# Patient Record
Sex: Female | Born: 1985 | Race: Asian | Hispanic: No | Marital: Married | Smoking: Never smoker
Health system: Southern US, Community
[De-identification: ages and names within clinical notes are randomized; demographics above are authoritative.]

## PROBLEM LIST (undated history)

## (undated) DIAGNOSIS — D649 Anemia, unspecified: Secondary | ICD-10-CM

## (undated) DIAGNOSIS — Z789 Other specified health status: Secondary | ICD-10-CM

## (undated) DIAGNOSIS — IMO0002 Reserved for concepts with insufficient information to code with codable children: Secondary | ICD-10-CM

## (undated) DIAGNOSIS — K219 Gastro-esophageal reflux disease without esophagitis: Secondary | ICD-10-CM

## (undated) HISTORY — PX: NO PAST SURGERIES: SHX2092

---

## 2009-12-29 ENCOUNTER — Emergency Department: Payer: Self-pay | Admitting: Emergency Medicine

## 2010-04-28 ENCOUNTER — Encounter: Payer: Self-pay | Admitting: Family Medicine

## 2010-04-28 ENCOUNTER — Ambulatory Visit: Payer: 59 | Admitting: Family Medicine

## 2010-04-28 DIAGNOSIS — J019 Acute sinusitis, unspecified: Secondary | ICD-10-CM

## 2010-04-28 DIAGNOSIS — J029 Acute pharyngitis, unspecified: Secondary | ICD-10-CM

## 2010-04-28 DIAGNOSIS — J309 Allergic rhinitis, unspecified: Secondary | ICD-10-CM | POA: Insufficient documentation

## 2010-05-03 NOTE — Assessment & Plan Note (Signed)
Summary: FEVER AND COUGH/EVM   Vital Signs:  Patient Profile:   25 Years Old Female CC:      fever, cough Height:     62 inches Weight:      156 pounds BMI:     28.64 O2 Sat:      98 % O2 treatment:    Room Air Temp:     98.0 degrees F oral Pulse rate:   98 / minute BP sitting:   126 / 87  (left arm) Cuff size:   regular  Vitals Entered By: Haze Boyden, CMA (April 28, 2010 3:06 PM)                  Current Allergies (reviewed today): No known allergies History of Present Illness History from: patient Reason for visit: see chief complaint Chief Complaint: fever, cough History of Present Illness: The patient reports that she is new to the area and having  sneezing, cough, runny nose, headaches in the frontal and parietal areas.  She is having some postnasal drainage and reports that she is having some sore throat.  The patient has been exposed to her spouse that has been diagnosed in the URI.   The patient denies nausea and vomiting.    REVIEW OF SYSTEMS Constitutional Symptoms       Complains of fever and chills.     Denies night sweats, weight loss, weight gain, and fatigue.  Eyes       Denies change in vision, eye pain, eye discharge, glasses, contact lenses, and eye surgery. Ear/Nose/Throat/Mouth       Complains of ear pain, frequent runny nose, and sore throat.      Denies hearing loss/aids, change in hearing, ear discharge, dizziness, frequent nose bleeds, sinus problems, hoarseness, and tooth pain or bleeding.  Respiratory       Complains of dry cough and productive cough.      Denies wheezing, shortness of breath, asthma, bronchitis, and emphysema/COPD.  Cardiovascular       Denies murmurs, chest pain, and tires easily with exhertion.    Gastrointestinal       Complains of stomach pain.      Denies nausea/vomiting, diarrhea, constipation, blood in bowel movements, and indigestion. Genitourniary       Denies painful urination, kidney stones, and loss of  urinary control. Neurological       Complains of headaches.      Denies paralysis, seizures, and fainting/blackouts. Musculoskeletal       Complains of muscle pain and joint pain.      Denies joint stiffness, decreased range of motion, redness, swelling, muscle weakness, and gout.  Skin       Denies bruising, unusual mles/lumps or sores, and hair/skin or nail changes.  Psych       Denies mood changes, temper/anger issues, anxiety/stress, speech problems, depression, and sleep problems.  Past History:  Family History: Last updated: 04/28/2010 mother alive healthy father alive healthy brother alive healthy  Social History: Last updated: 04/28/2010 Married Never Smoked Alcohol use-no Drug use-no Regular exercise-no  Risk Factors: Exercise: no (04/28/2010)  Risk Factors: Smoking Status: never (04/28/2010)  Past Medical History: Unremarkable  Past Surgical History: Denies surgical history  Family History: mother alive healthy father alive healthy brother alive healthy  Social History: Married Never Smoked Alcohol use-no Drug use-no Regular exercise-no Smoking Status:  never Drug Use:  no Does Patient Exercise:  no Physical Exam General appearance: well developed, well nourished, no acute distress  Head: normocephalic, atraumatic Eyes: conjunctivae and lids normal Pupils: equal, round, reactive to light Ears: normal, no lesions or deformities Nasal: pale, boggy, swollen nasal turbinates Oral/Pharynx: pharyngeal erythema without exudate, uvula midline without deviation Neck: neck supple,  trachea midline, no masses Chest/Lungs: no rales, wheezes, or rhonchi bilateral, breath sounds equal without effort Heart: regular rate and  rhythm, no murmur Abdomen: soft, non-tender without obvious organomegaly Extremities: normal extremities Neurological: grossly intact and non-focal Skin: no obvious rashes or lesions MSE: oriented to time, place, and  person Assessment New Problems: ALLERGIC RHINITIS CAUSE UNSPECIFIED (ICD-477.9) ACUTE SINUSITIS, UNSPECIFIED (ICD-461.9) SORE THROAT (ICD-462)   Patient Education: Patient and/or caregiver instructed in the following: rest, fluids, Tylenol prn, Ibuprofen prn. The risks, benefits and possible side effects were clearly explained and discussed with the patient.  The patient verbalized clear understanding.  The patient was given instructions to return if symptoms don't improve, worsen or new changes develop.  If it is not during clinic hours and the patient cannot get back to this clinic then the patient was told to seek medical care at an available urgent care or emergency department.  The patient verbalized understanding.   Demonstrates willingness to comply.  Plan New Medications/Changes: IBUPROFEN 400 MG TABS (IBUPROFEN) take 1 to 2 tabs by mouth every 8 hours with food as needed headache  #12 x 0, 04/28/2010, Clanford Johnson MD LORATADINE 10 MG TABS (LORATADINE) take 1 by mouth daily for allergies Runny Nose, Sneezing, Nasal Congestion  #30 x 1, 04/28/2010, Clanford Johnson MD FLUTICASONE PROPIONATE 50 MCG/ACT SUSP (FLUTICASONE PROPIONATE) 2 sprays per nostril once daily  #1 x 0, 04/28/2010, Clanford Johnson MD AMOXICILLIN 500 MG CAPS (AMOXICILLIN) take 1 by mouth three times a day until completed  #30 x 0, 04/28/2010, Clanford Johnson MD  Follow Up: Follow up in 2-3 days if no improvement, Follow up on an as needed basis, Follow up with Primary Physician  The patient and/or caregiver has been counseled thoroughly with regard to medications prescribed including dosage, schedule, interactions, rationale for use, and possible side effects and they verbalize understanding.  Diagnoses and expected course of recovery discussed and will return if not improved as expected or if the condition worsens. Patient and/or caregiver verbalized understanding.  Prescriptions: IBUPROFEN 400 MG TABS  (IBUPROFEN) take 1 to 2 tabs by mouth every 8 hours with food as needed headache  #12 x 0   Entered and Authorized by:   Standley Dakins MD   Signed by:   Standley Dakins MD on 04/28/2010   Method used:   Electronically to        [facility] [address] (retail)       [address], [address]       [place], [place]  [address]       Ph: [phone]       Fax: [phone]   RxID:   [id number] LORATADINE 10 MG TABS (LORATADINE) take 1 by mouth daily for allergies Runny Nose, Sneezing, Nasal Congestion  #30 x 1   Entered and Authorized by:   Standley Dakins MD   Signed by:   Standley Dakins MD on 04/28/2010   Method used:   Electronically to        [facility] [address] (retail)       [address], [address]       [place], [place]  [address]       Ph: [phone]       Fax: [phone]   RxID:   [id number] FLUTICASONE PROPIONATE 50 MCG/ACT SUSP (FLUTICASONE PROPIONATE) 2 sprays per nostril once daily  #1 x 0   Entered and Authorized by:   Standley Dakins MD   Signed by:   Standley Dakins MD on 04/28/2010   Method used:   Electronically to        [facility] [address] (retail)       [address], [address]       [place], [place]  [address]       Ph: [phone]       Fax: [phone]   RxID:   [id number] AMOXICILLIN 500 MG CAPS (AMOXICILLIN) take 1 by mouth three times a day until completed  #30 x 0   Entered and Authorized by:   Standley Dakins MD   Signed by:   Standley Dakins MD on 04/28/2010   Method used:   Electronically to        [facility] [address] (retail)       [address], [address]       [place], [place]  [address]       Ph: [phone]       Fax: [phone]   RxID:   [id number]   Patient Instructions: 1)  Go to the pharmacy and pick up your prescription (s).  It may take up to 30 mins for electronic  prescriptions to be delivered to the pharmacy.  Please call if your pharmacy has not received your prescriptions after 30 minutes.   2)  Take 650-1000mg  of Tylenol every 4-6 hours as needed for relief of pain or comfort of fever AVOID taking more than 4000mg   in a 24 hour period (can cause liver damage in higher doses). 3)  Take 400-600mg  of Ibuprofen (Advil, Motrin) with food every 8 hours as needed for relief of pain or comfort of fever. 4)  Oral Rehydration Solution: drink 1/2 ounce every 15 minutes. If tolerated afert 1 hour, drink 1 ounce every 15 minutes. As you can tolerate, keep adding 1/2 ounce every 15 minutes, up to a total of 2-4 ounces. Contact the office if unable to tolerate oral solution, if you keep vomiting, or you continue to have signs of dehydration. 5)  Take your antibiotic as prescribed until ALL of it is gone, but stop if you develop a rash or swelling and contact our office as soon as possible. 6)  Acute sinusitis symptoms for less than 10 days are not helped by antibiotics.Use warm moist compresses, and over the counter decongestants ( only as directed). Call if no improvement in 5-7 days, sooner if increasing pain, fever, or new symptoms. 7)  Return or go to the ER if no improvement or symptoms getting worse.   8)  The patient was informed that there is no on-call provider or services available at this clinic during off-hours (when the clinic is closed).  If the patient developed a problem or concern that required immediate attention, the patient was advised to go the the nearest available urgent care or emergency department for medical care.  The patient verbalized understanding.

## 2012-04-20 ENCOUNTER — Other Ambulatory Visit (HOSPITAL_COMMUNITY): Payer: Self-pay | Admitting: Obstetrics & Gynecology

## 2012-04-20 DIAGNOSIS — N979 Female infertility, unspecified: Secondary | ICD-10-CM

## 2012-04-24 ENCOUNTER — Ambulatory Visit (HOSPITAL_COMMUNITY)
Admission: RE | Admit: 2012-04-24 | Discharge: 2012-04-24 | Disposition: A | Discharge status: Home / Self Care | Payer: 59 | Source: Ambulatory Visit | Attending: Obstetrics & Gynecology | Admitting: Obstetrics & Gynecology

## 2012-04-24 DIAGNOSIS — N979 Female infertility, unspecified: Secondary | ICD-10-CM | POA: Insufficient documentation

## 2012-04-24 MED ORDER — IOHEXOL 300 MG/ML  SOLN
20.0000 mL | Freq: Once | INTRAMUSCULAR | Status: AC | PRN
  Administered 2012-04-24: 20 mL

## 2012-10-06 LAB — OB RESULTS CONSOLE ABO/RH: RH Type: POSITIVE

## 2012-10-06 LAB — OB RESULTS CONSOLE RPR: RPR: NONREACTIVE

## 2012-10-06 LAB — OB RESULTS CONSOLE RUBELLA ANTIBODY, IGM: Rubella: IMMUNE

## 2012-10-06 LAB — OB RESULTS CONSOLE HEPATITIS B SURFACE ANTIGEN: Hepatitis B Surface Ag: NEGATIVE

## 2012-10-06 LAB — OB RESULTS CONSOLE HIV ANTIBODY (ROUTINE TESTING): HIV: NONREACTIVE

## 2012-10-06 LAB — OB RESULTS CONSOLE ANTIBODY SCREEN: ANTIBODY SCREEN: NEGATIVE

## 2012-10-16 LAB — OB RESULTS CONSOLE GC/CHLAMYDIA
CHLAMYDIA, DNA PROBE: NEGATIVE
GC PROBE AMP, GENITAL: NEGATIVE

## 2013-03-18 NOTE — L&D Delivery Note (Signed)
Operative Delivery Note At 4:45 PM a viable and healthy female was delivered via Vaginal, Vacuum Investment banker, operational(Extractor).  Presentation: vertex; Position: Occiput,, Anterior; Station: +2. 3 controlled pushes with 2 contractions each with excellent maternal effort and no pop-offs.   Verbal consent: obtained from patient.  Risks and benefits discussed in detail.  Risks include, but are not limited to the risks of anesthesia, bleeding, infection, damage to maternal tissues, fetal cephalhematoma.  There is also the risk of inability to effect vaginal delivery of the head, or shoulder dystocia that cannot be resolved by established maneuvers, leading to the need for emergency cesarean section.  APGAR: 9, 9; weight .   Placenta status: Intact, Spontaneous.   Cord: 3 vessels with the following complications: None.  Cord pH: 7.3  Anesthesia: Epidural Local 1% Lidocaine Instruments: Kiwi vacuum Episiotomy: Left Mediolateral Lacerations: Sulcus bilateral Suture Repair: 3.0 vicryl rapide Est. Blood Loss (mL): 500  Mom to postpartum.  Baby to Couplet care / Skin to Skin.  Eldra Word R 04/29/2013, 5:29 PM

## 2013-04-09 LAB — OB RESULTS CONSOLE GBS: GBS: POSITIVE

## 2013-04-29 ENCOUNTER — Inpatient Hospital Stay (HOSPITAL_COMMUNITY)
Admission: AD | Admit: 2013-04-29 | Discharge: 2013-05-01 | DRG: 775 | Disposition: A | Discharge status: Home / Self Care | Payer: 59 | Source: Ambulatory Visit | Attending: Obstetrics & Gynecology | Admitting: Obstetrics & Gynecology

## 2013-04-29 ENCOUNTER — Encounter (HOSPITAL_COMMUNITY): Payer: 59 | Admitting: Anesthesiology

## 2013-04-29 ENCOUNTER — Encounter (HOSPITAL_COMMUNITY): Payer: Self-pay

## 2013-04-29 ENCOUNTER — Inpatient Hospital Stay (HOSPITAL_COMMUNITY): Payer: 59 | Admitting: Anesthesiology

## 2013-04-29 DIAGNOSIS — IMO0001 Reserved for inherently not codable concepts without codable children: Secondary | ICD-10-CM

## 2013-04-29 DIAGNOSIS — O99892 Other specified diseases and conditions complicating childbirth: Secondary | ICD-10-CM | POA: Diagnosis present

## 2013-04-29 DIAGNOSIS — E282 Polycystic ovarian syndrome: Secondary | ICD-10-CM | POA: Diagnosis present

## 2013-04-29 DIAGNOSIS — Z22330 Carrier of Group B streptococcus: Secondary | ICD-10-CM

## 2013-04-29 DIAGNOSIS — J309 Allergic rhinitis, unspecified: Secondary | ICD-10-CM

## 2013-04-29 DIAGNOSIS — O34599 Maternal care for other abnormalities of gravid uterus, unspecified trimester: Secondary | ICD-10-CM | POA: Diagnosis present

## 2013-04-29 DIAGNOSIS — O9989 Other specified diseases and conditions complicating pregnancy, childbirth and the puerperium: Secondary | ICD-10-CM

## 2013-04-29 HISTORY — DX: Reserved for concepts with insufficient information to code with codable children: IMO0002

## 2013-04-29 LAB — CBC
HCT: 39.7 % (ref 36.0–46.0)
Hemoglobin: 13.4 g/dL (ref 12.0–15.0)
MCH: 28 pg (ref 26.0–34.0)
MCHC: 33.8 g/dL (ref 30.0–36.0)
MCV: 83.1 fL (ref 78.0–100.0)
Platelets: 193 10*3/uL (ref 150–400)
RBC: 4.78 MIL/uL (ref 3.87–5.11)
RDW: 14.2 % (ref 11.5–15.5)
WBC: 10.9 10*3/uL — ABNORMAL HIGH (ref 4.0–10.5)

## 2013-04-29 LAB — TYPE AND SCREEN
ABO/RH(D): A POS
Antibody Screen: NEGATIVE

## 2013-04-29 LAB — ABO/RH: ABO/RH(D): A POS

## 2013-04-29 LAB — RPR: RPR Ser Ql: NONREACTIVE

## 2013-04-29 LAB — POCT FERN TEST: POCT FERN TEST: POSITIVE

## 2013-04-29 MED ORDER — DEXTROSE 5 % IV SOLN
5.0000 10*6.[IU] | Freq: Once | INTRAVENOUS | Status: AC
  Administered 2013-04-29: 5 10*6.[IU] via INTRAVENOUS
  Filled 2013-04-29: qty 5

## 2013-04-29 MED ORDER — ZOLPIDEM TARTRATE 5 MG PO TABS: 5.0000 mg | ORAL_TABLET | Freq: Every evening | ORAL | Status: DC | PRN

## 2013-04-29 MED ORDER — LACTATED RINGERS IV SOLN: 500.0000 mL | INTRAVENOUS | Status: DC | PRN

## 2013-04-29 MED ORDER — LIDOCAINE HCL (PF) 1 % IJ SOLN
INTRAMUSCULAR | Status: DC | PRN
  Administered 2013-04-29 (×2): 9 mL

## 2013-04-29 MED ORDER — BENZOCAINE-MENTHOL 20-0.5 % EX AERO
1.0000 "application " | INHALATION_SPRAY | CUTANEOUS | Status: DC | PRN
  Administered 2013-05-01 (×2): 1 via TOPICAL
  Filled 2013-04-29 (×3): qty 56

## 2013-04-29 MED ORDER — ONDANSETRON HCL 4 MG/2ML IJ SOLN: 4.0000 mg | INTRAMUSCULAR | Status: DC | PRN

## 2013-04-29 MED ORDER — ONDANSETRON HCL 4 MG PO TABS: 4.0000 mg | ORAL_TABLET | ORAL | Status: DC | PRN

## 2013-04-29 MED ORDER — FENTANYL 2.5 MCG/ML BUPIVACAINE 1/10 % EPIDURAL INFUSION (WH - ANES)
INTRAMUSCULAR | Status: DC | PRN
  Administered 2013-04-29: 14 mL/h via EPIDURAL

## 2013-04-29 MED ORDER — IBUPROFEN 600 MG PO TABS: 600.0000 mg | ORAL_TABLET | Freq: Four times a day (QID) | ORAL | Status: DC | PRN

## 2013-04-29 MED ORDER — CITRIC ACID-SODIUM CITRATE 334-500 MG/5ML PO SOLN: 30.0000 mL | ORAL | Status: DC | PRN

## 2013-04-29 MED ORDER — SENNOSIDES-DOCUSATE SODIUM 8.6-50 MG PO TABS
2.0000 | ORAL_TABLET | ORAL | Status: DC
  Administered 2013-04-30: 2 via ORAL
  Filled 2013-04-29 (×2): qty 2

## 2013-04-29 MED ORDER — ONDANSETRON HCL 4 MG/2ML IJ SOLN
4.0000 mg | Freq: Four times a day (QID) | INTRAMUSCULAR | Status: DC | PRN
  Administered 2013-04-29: 4 mg via INTRAVENOUS
  Filled 2013-04-29: qty 2

## 2013-04-29 MED ORDER — PHENYLEPHRINE 40 MCG/ML (10ML) SYRINGE FOR IV PUSH (FOR BLOOD PRESSURE SUPPORT)
80.0000 ug | PREFILLED_SYRINGE | INTRAVENOUS | Status: DC | PRN
  Filled 2013-04-29: qty 2

## 2013-04-29 MED ORDER — EPHEDRINE 5 MG/ML INJ
10.0000 mg | INTRAVENOUS | Status: DC | PRN
  Filled 2013-04-29: qty 2

## 2013-04-29 MED ORDER — ACETAMINOPHEN 325 MG PO TABS: 650.0000 mg | ORAL_TABLET | ORAL | Status: DC | PRN

## 2013-04-29 MED ORDER — DIPHENHYDRAMINE HCL 25 MG PO CAPS: 25.0000 mg | ORAL_CAPSULE | Freq: Four times a day (QID) | ORAL | Status: DC | PRN

## 2013-04-29 MED ORDER — TERBUTALINE SULFATE 1 MG/ML IJ SOLN: 0.2500 mg | Freq: Once | INTRAMUSCULAR | Status: DC | PRN

## 2013-04-29 MED ORDER — OXYTOCIN 40 UNITS IN LACTATED RINGERS INFUSION - SIMPLE MED: 62.5000 mL/h | INTRAVENOUS | Status: DC

## 2013-04-29 MED ORDER — EPHEDRINE 5 MG/ML INJ
10.0000 mg | INTRAVENOUS | Status: DC | PRN
  Filled 2013-04-29: qty 2
  Filled 2013-04-29: qty 4

## 2013-04-29 MED ORDER — PHENYLEPHRINE 40 MCG/ML (10ML) SYRINGE FOR IV PUSH (FOR BLOOD PRESSURE SUPPORT)
80.0000 ug | PREFILLED_SYRINGE | INTRAVENOUS | Status: DC | PRN
  Filled 2013-04-29: qty 2
  Filled 2013-04-29: qty 10

## 2013-04-29 MED ORDER — LIDOCAINE HCL (PF) 1 % IJ SOLN
30.0000 mL | INTRAMUSCULAR | Status: DC | PRN
  Administered 2013-04-29: 30 mL via SUBCUTANEOUS
  Filled 2013-04-29: qty 30

## 2013-04-29 MED ORDER — PRENATAL MULTIVITAMIN CH
1.0000 | ORAL_TABLET | Freq: Every day | ORAL | Status: DC
  Administered 2013-05-01: 1 via ORAL
  Filled 2013-04-29: qty 1

## 2013-04-29 MED ORDER — OXYTOCIN 40 UNITS IN LACTATED RINGERS INFUSION - SIMPLE MED
1.0000 m[IU]/min | INTRAVENOUS | Status: DC
  Administered 2013-04-29: 1 m[IU]/min via INTRAVENOUS
  Filled 2013-04-29: qty 1000

## 2013-04-29 MED ORDER — LANOLIN HYDROUS EX OINT: TOPICAL_OINTMENT | CUTANEOUS | Status: DC | PRN

## 2013-04-29 MED ORDER — OXYTOCIN BOLUS FROM INFUSION: 500.0000 mL | INTRAVENOUS | Status: DC

## 2013-04-29 MED ORDER — OXYCODONE-ACETAMINOPHEN 5-325 MG PO TABS
1.0000 | ORAL_TABLET | ORAL | Status: DC | PRN
  Administered 2013-04-30: 1 via ORAL
  Filled 2013-04-29: qty 1

## 2013-04-29 MED ORDER — FENTANYL 2.5 MCG/ML BUPIVACAINE 1/10 % EPIDURAL INFUSION (WH - ANES)
14.0000 mL/h | INTRAMUSCULAR | Status: DC | PRN
  Administered 2013-04-29: 14 mL/h via EPIDURAL
  Filled 2013-04-29 (×2): qty 125

## 2013-04-29 MED ORDER — BUTORPHANOL TARTRATE 1 MG/ML IJ SOLN: 1.0000 mg | INTRAMUSCULAR | Status: DC | PRN

## 2013-04-29 MED ORDER — SIMETHICONE 80 MG PO CHEW: 80.0000 mg | CHEWABLE_TABLET | ORAL | Status: DC | PRN

## 2013-04-29 MED ORDER — WITCH HAZEL-GLYCERIN EX PADS: 1.0000 "application " | MEDICATED_PAD | CUTANEOUS | Status: DC | PRN

## 2013-04-29 MED ORDER — DIBUCAINE 1 % RE OINT: 1.0000 "application " | TOPICAL_OINTMENT | RECTAL | Status: DC | PRN

## 2013-04-29 MED ORDER — LACTATED RINGERS IV SOLN: INTRAVENOUS | Status: DC

## 2013-04-29 MED ORDER — IBUPROFEN 600 MG PO TABS
600.0000 mg | ORAL_TABLET | Freq: Four times a day (QID) | ORAL | Status: DC
  Administered 2013-04-30 – 2013-05-01 (×7): 600 mg via ORAL
  Filled 2013-04-29 (×7): qty 1

## 2013-04-29 MED ORDER — OXYCODONE-ACETAMINOPHEN 5-325 MG PO TABS: 1.0000 | ORAL_TABLET | ORAL | Status: DC | PRN

## 2013-04-29 MED ORDER — LACTATED RINGERS IV SOLN
500.0000 mL | Freq: Once | INTRAVENOUS | Status: AC
  Administered 2013-04-29: 500 mL via INTRAVENOUS

## 2013-04-29 MED ORDER — PENICILLIN G POTASSIUM 5000000 UNITS IJ SOLR
2.5000 10*6.[IU] | INTRAVENOUS | Status: DC
  Administered 2013-04-29 (×2): 2.5 10*6.[IU] via INTRAVENOUS
  Filled 2013-04-29 (×5): qty 2.5

## 2013-04-29 MED ORDER — DIPHENHYDRAMINE HCL 50 MG/ML IJ SOLN: 12.5000 mg | INTRAMUSCULAR | Status: DC | PRN

## 2013-04-29 NOTE — MAU Note (Signed)
ROM at 0300, contractions

## 2013-04-29 NOTE — Progress Notes (Signed)
Tina AlconSaranya Francis is a 28 y.o. G1P0 at 8051w5d, SROM 3 am. Progressed well, comfortable with epidural.  Complete at 12.45 pm and now pushing since 1 hr with some progress.   BP 115/78  Pulse 97  Temp(Src) 98.8 F (37.1 C) (Oral)  Resp 16  Ht 5\' 2"  (1.575 m)  Wt 180 lb (81.647 kg)  BMI 32.91 kg/m2  SpO2 100%    FHT:  FHR: 145 bpm, variability: moderate,  accelerations:  Present,  decelerations:  Absent UC:   regular, every 2-3 minutes SVE:   Dilation: 10 Effacement (%): 100 Station: +2 Exam by:: J.Thornton, RN  Stage II, Continue pushing, poor effort. FHT category I.   Tina Francis 04/29/2013, 4:21 PM

## 2013-04-29 NOTE — Progress Notes (Signed)
Tina Francis is a 28 y.o. G1P0 at 2734w5d, came in labor, GBS, PCN 2 doses.    Objective: BP 114/68  Pulse 97  Temp(Src) 97.8 F (36.6 C) (Oral)  Resp 16  Ht 5\' 2"  (1.575 m)  Wt 180 lb (81.647 kg)  BMI 32.91 kg/m2  SpO2 100%     FHT:  FHR: 120 bpm, variability: moderate,  accelerations:  Present,  decelerations:  Absent UC:   regular, every 2 minutes SVE:   Dilation: 10 Effacement (%): 100 Station: +1 Exam by:: J.Thornton, RN  Labs: Lab Results  Component Value Date   WBC 10.9* 04/29/2013   HGB 13.4 04/29/2013   HCT 39.7 04/29/2013   MCV 83.1 04/29/2013   PLT 193 04/29/2013    Assessment / Plan: Complete, allow more descent, FHT category I  Tina Francis 04/29/2013, 12:46 PM

## 2013-04-29 NOTE — Anesthesia Procedure Notes (Signed)
Epidural Patient location during procedure: OB Start time: 04/29/2013 7:54 AM End time: 04/29/2013 7:58 AM  Staffing Anesthesiologist: Leilani AbleHATCHETT, Kin Galbraith Performed by: anesthesiologist   Preanesthetic Checklist Completed: patient identified, surgical consent, pre-op evaluation, timeout performed, IV checked, risks and benefits discussed and monitors and equipment checked  Epidural Patient position: sitting Prep: site prepped and draped and DuraPrep Patient monitoring: continuous pulse ox and blood pressure Approach: midline Injection technique: LOR air  Needle:  Needle type: Tuohy  Needle gauge: 17 G Needle length: 9 cm and 9 Needle insertion depth: 5 cm cm Catheter type: closed end flexible Catheter size: 19 Gauge Catheter at skin depth: 10 cm Test dose: negative and Other  Assessment Sensory level: T9 Events: blood not aspirated, injection not painful, no injection resistance, negative IV test and no paresthesia  Additional Notes Reason for block:procedure for pain

## 2013-04-29 NOTE — H&P (Signed)
Tina AlconSaranya Francis is a 28 y.o. female presenting G1 at 37.5 wks with SROM at 3 am, clear fluid with pink tinge. UCs started soon after. Good FMs. Maternal Medical History:  Reason for admission: Rupture of membranes, contractions and vaginal bleeding.    [facility], primary Dr Juliene PinaMody. Spontaneous pregnancy after several attempts of Clomid. PCOS, on Metformin in 1st trimester, passed 3 hr GTT, good interval growth 6'7" at 37 wks.  GBS(+)  OB History   Grav Para Term Preterm Abortions TAB SAB Ect Mult Living   1              Past Medical History  Diagnosis Date  . Infertility    Past Surgical History  Procedure Laterality Date  . No past surgeries     Family History: family history is not on file. Social History:  reports that she has never smoked. She does not have any smokeless tobacco history on file. She reports that she does not drink alcohol or use illicit drugs.   Prenatal Transfer Tool  Maternal Diabetes: No Genetic Screening: Normal AFP4 negative Maternal Ultrasounds/Referrals: Normal Fetal Ultrasounds or other Referrals:  None Maternal Substance Abuse:  No Significant Maternal Medications:  None Significant Maternal Lab Results:  Lab values include: Group B Strep positive Other Comments:  None  ROS neg    Blood pressure 131/76, pulse 82, temperature 97.5 F (36.4 C), temperature source Oral, resp. rate 18, height 5\' 2"  (1.575 m), weight 180 lb (81.647 kg), SpO2 100.00%. Exam Physical Exam  A&O x 3, no acute distress. Pleasant HEENT neg, no thyromegaly Lungs CTA bilat CV RRR, S1S2 normal Abdo soft, non tender, non acute Extr no edema/ tenderness Pelvic  FHT 120s/ category I Toco q 4-5 min, mild per patient  Prenatal labs: ABO, Rh: A/Positive/-- (07/22 0000) Antibody: Negative (07/22 0000) Rubella: Immune (07/22 0000) RPR: Nonreactive (07/22 0000)  HBsAg: Negative (07/22 0000)  HIV: Non-reactive (07/22 0000)  GBS: Positive (01/23 0000)    Assessment/Plan: 28 yo, G1 at 37.5 wks with SROM since 3 am, clear fluid. Admit, PCN for GBS, Stadol or Epidural in active labor as desired, EFW 6.1/2 lbs, Vtx. Pelvis narrow, monitor progress.Marland Kitchen. FHT category I.  Shaft Corigliano R 04/29/2013, 6:07 AM

## 2013-04-29 NOTE — Anesthesia Preprocedure Evaluation (Signed)
Anesthesia Evaluation  Patient identified by MRN, date of birth, ID band Patient awake    Reviewed: Allergy & Precautions, H&P , NPO status , Patient's Chart, lab work & pertinent test results  Airway Mallampati: I TM Distance: >3 FB Neck ROM: full    Dental no notable dental hx. (+) Teeth Intact   Pulmonary neg pulmonary ROS,    Pulmonary exam normal       Cardiovascular negative cardio ROS      Neuro/Psych negative neurological ROS  negative psych ROS   GI/Hepatic negative GI ROS, Neg liver ROS,   Endo/Other  negative endocrine ROS  Renal/GU negative Renal ROS  negative genitourinary   Musculoskeletal negative musculoskeletal ROS (+)   Abdominal Normal abdominal exam  (+)   Peds  Hematology negative hematology ROS (+)   Anesthesia Other Findings   Reproductive/Obstetrics negative OB ROS (+) Pregnancy                           Anesthesia Physical Anesthesia Plan  ASA: II  Anesthesia Plan: Epidural   Post-op Pain Management:    Induction:   Airway Management Planned:   Additional Equipment:   Intra-op Plan:   Post-operative Plan:   Informed Consent: I have reviewed the patients History and Physical, chart, labs and discussed the procedure including the risks, benefits and alternatives for the proposed anesthesia with the patient or authorized representative who has indicated his/her understanding and acceptance.   Dental Advisory Given and History available from chart only  Plan Discussed with: CRNA  Anesthesia Plan Comments:         Anesthesia Quick Evaluation

## 2013-04-30 LAB — CBC
HCT: 32 % — ABNORMAL LOW (ref 36.0–46.0)
Hemoglobin: 10.5 g/dL — ABNORMAL LOW (ref 12.0–15.0)
MCH: 27.5 pg (ref 26.0–34.0)
MCHC: 32.8 g/dL (ref 30.0–36.0)
MCV: 83.8 fL (ref 78.0–100.0)
PLATELETS: 168 10*3/uL (ref 150–400)
RBC: 3.82 MIL/uL — AB (ref 3.87–5.11)
RDW: 14.3 % (ref 11.5–15.5)
WBC: 12.6 10*3/uL — ABNORMAL HIGH (ref 4.0–10.5)

## 2013-04-30 NOTE — Progress Notes (Signed)
Patient ID: Nicanor AlconSaranya Murgia, female   DOB: [birth year], 28 y.o.   MRN: [id number] PPD # 1  Subjective: Pt reports feeling well, sore / Pain controlled with ibuprofen and percocet Tolerating po/ Voiding without problems/ No n/v Bleeding is light Newborn info:  Information for the patient's newborn:  Ronnie DossKrishnan, Boy Lucendia [[id number]][[id number]]  female  / circ not planned / Feeding: breast   Objective:  VS: Blood pressure 111/69, pulse 83, temperature 97.5 F (36.4 C), temperature source Oral, resp. rate 20.    Recent Labs  04/29/13 0525 04/30/13 0550  WBC 10.9* 12.6*  HGB 13.4 10.5*  HCT 39.7 32.0*  PLT 193 168    Blood type: A POS Rubella: Immune    Physical Exam:  General:  alert, cooperative and no distress CV: Regular rate and rhythm Resp: clear Abdomen: soft, nontender, normal bowel sounds Uterine Fundus: firm, below umbilicus, nontender Perineum: healing with good reapproximation and mod edema Lochia: minimal Ext: edema none and Homans sign is negative, no sign of DVT   A/P: PPD # 1/ G1P1001/ S/P: VAVD with mediolateral epis Doing well Continue routine post partum orders Anticipate D/C home in AM    Demetrius RevelFISHER,Aviva Wolfer K, MSN, [facility]NP 04/30/2013, 5:00 PM

## 2013-04-30 NOTE — Lactation Note (Signed)
This note was copied from the chart of Tina Nicanor AlconSaranya Runkel. Lactation Consultation Note  Patient Name: Tina Nicanor AlconSaranya Burchfield ZOXWR'UToday's Date: 04/30/2013 Reason for consult: Initial assessment Baby 19 hours old, showing feeding cues. Mom reports baby favoring left breast and fights football position when on right breast. Enc mom to use cross-cradle on right breast and assisted to latch baby in this position. Baby latched well. Assisted mom to hand express right breast, no colostrum at this time. Baby's latch deeply and suckling well. Enc mom to re-latch if baby slips to tip of nipple. Mom reports very comfortable nursing on right breast now. Reviewed basics. Enc STS and feeding with cues. Mom given [facility], aware of OP/BFSG services and resources. Mom enc to call out for assistance as needed.  Maternal Data Formula Feeding for Exclusion: No Infant to breast within first hour of birth: Yes Has patient been taught Hand Expression?: Yes Does the patient have breastfeeding experience prior to this delivery?: No  Feeding Feeding Type: Breast Fed (LC saw first 15 minutes of feeding, [facility] Length of feed: 15 min  LATCH Score/Interventions Latch: Grasps breast easily, tongue down, lips flanged, rhythmical sucking. Intervention(s): Adjust position;Assist with latch;Breast compression  Audible Swallowing: None Intervention(s): Skin to skin;Hand expression Intervention(s): Skin to skin  Type of Nipple: Everted at rest and after stimulation  Comfort (Breast/Nipple): Soft / non-tender  Problem noted:  (Mild sorness on left breast because baby was favoring left breast.) Interventions (Mild/moderate discomfort): Comfort gels  Hold (Positioning): Assistance needed to correctly position infant at breast and maintain latch.  LATCH Score: 7  Lactation Tools Discussed/Used Tools: Comfort gels   Consult Status Consult Status: Follow-up Date: 05/01/13 Follow-up  type: In-patient    Geralynn OchsWILLIARD, Arden Tinoco 04/30/2013, 12:21 PM

## 2013-04-30 NOTE — Anesthesia Postprocedure Evaluation (Signed)
  Anesthesia Post-op Note  Anesthesia Post Note  Patient: Tina Francis  Procedure(s) Performed: * No procedures listed *  Anesthesia type: Epidural  Patient location: Mother/Baby  Post pain: Pain level controlled  Post assessment: Post-op Vital signs reviewed  Last Vitals:  Filed Vitals:   04/30/13 0023  BP: 110/63  Pulse: 99  Temp: 36.8 C  Resp: 18    Post vital signs: Reviewed  Level of consciousness:alert  Complications: No apparent anesthesia complications

## 2013-05-01 MED ORDER — IBUPROFEN 600 MG PO TABS: 600.0000 mg | ORAL_TABLET | Freq: Four times a day (QID) | ORAL | Status: DC | PRN

## 2013-05-01 MED ORDER — OXYCODONE-ACETAMINOPHEN 5-325 MG PO TABS: 1.0000 | ORAL_TABLET | Freq: Four times a day (QID) | ORAL | Status: DC | PRN

## 2013-05-01 MED ORDER — SENNOSIDES-DOCUSATE SODIUM 8.6-50 MG PO TABS: 2.0000 | ORAL_TABLET | Freq: Every evening | ORAL | Status: DC | PRN

## 2013-05-01 NOTE — Discharge Summary (Signed)
Obstetric Discharge Summary Reason for Admission: G1 at 37.5 wks with SROM since 3 am, clear fluid. Admit, PCN for GBS, Stadol or Epidural in active labor as desired, EFW 6.1/2 lbs, Vtx. Pelvis narrow, monitor progress Prenatal Procedures: NST and ultrasound Intrapartum Procedures: VAVD Postpartum Procedures: none Complications-Operative and Postpartum: mediolateral episiotomy Hemoglobin  Date Value Ref Range Status  04/30/2013 10.5* 12.0 - 15.0 g/dL Final     REPEATED TO VERIFY     DELTA CHECK NOTED     HCT  Date Value Ref Range Status  04/30/2013 32.0* 36.0 - 46.0 % Final    Physical Exam:  General: alert, cooperative and no distress Lochia: appropriate Uterine Fundus: firm Incision: healing well DVT Evaluation: No evidence of DVT seen on physical exam.  Discharge Diagnoses: G1 P0 s/p VAVD 2 wks with mediolateral episiotomy  Discharge Information: Date: 05/01/2013 Activity: pelvic rest Diet: routine Medications: PNV, Ibuprofen, Colace and Percocet Condition: stable Instructions: refer to practice specific booklet Discharge to: home Follow-up Information   Follow up with Tanji Storrs R, MD In 6 weeks.   Specialty:  Obstetrics and Gynecology   Contact information:   En[address] Gash[address] [place] [id number] [phone]       Newborn Data: Live born female on 04/29/13 Birth Weight: 6 lb 11.6 oz (3050 g) APGAR: 9, 9  Home with mother.  FISHER,JULIE K 05/01/2013, 10:32 AM

## 2013-05-01 NOTE — Lactation Note (Signed)
This note was copied from the chart of Tina Nicanor AlconSaranya Spellman. Lactation Consultation Note  Patient Name: Tina Francis ZOXWR'UToday's Date: 05/01/2013 Reason for consult: Follow-up assessment Per mom nipples are sore , left > than right , LC assessed and noticed the  Left to have a positional strip and the right pinky red , has been using comfort gels . LC reviewed basics - breast massage , hand express, prepump before each latch until the soreness improve. Latch with firm support and breast compressions and have dad ease baby's chin down for depth . @ consult assisted with latch , x3 times , and discomfort noted at 1st and improved once depth achieved. Also reviewed sore nipple tx and engorgement prevention and tx .  Referring to pages 20 -24 in Baby and me booklet. Mom and dad aware of the [facility] and [facility]. Mom has been instructed on comfort gels , hand pump and shells .   Maternal Data Has patient been taught Hand Expression?: Yes  Feeding Feeding Type: Breast Fed (back to right breast ) Length of feed: 10 min  LATCH Score/Interventions Latch: Grasps breast easily, tongue down, lips flanged, rhythmical sucking. Intervention(s): Adjust position;Assist with latch;Breast massage;Breast compression  Audible Swallowing: A few with stimulation Intervention(s): Skin to skin;Hand expression;Alternate breast massage  Type of Nipple: Everted at rest and after stimulation (less swollen areolas )  Comfort (Breast/Nipple): Soft / non-tender  Problem noted: Mild/Moderate discomfort  Hold (Positioning): Assistance needed to correctly position infant at breast and maintain latch. Intervention(s): Breastfeeding basics reviewed;Support Pillows;Position options;Skin to skin  LATCH Score: 8  Lactation Tools Discussed/Used Tools: Shells;Pump Shell Type: Inverted Breast pump type: Manual Pump Review: Setup, frequency, and cleaning;Milk Storage Initiated by:: MAI  Date initiated::  05/01/13   Consult Status Consult Status: Complete Date: 05/01/13 Follow-up type: In-patient    Kathrin Greathouseorio, Tina Francis 05/01/2013, 12:10 PM

## 2013-05-01 NOTE — Progress Notes (Signed)
Patient ID: Nicanor AlconSaranya Mijangos, female   DOB: [birth year], 28 y.o.   MRN: [id number] PPD # 2  Subjective: Pt reports feeling well and eager for d/c home/ Pain controlled with ibuprofen and occ percocet Tolerating po/ Voiding without problems/ No n/v Bleeding is light/ Newborn info:  Information for the patient's newborn:  Ronnie DossKrishnan, Boy Donetta [[id number]][[id number]]  female Feeding: breast    Objective:  VS: Blood pressure 102/68, pulse 70, temperature 98 F (36.7 C), temperature source Oral, resp. rate 18    Recent Labs  04/29/13 0525 04/30/13 0550  WBC 10.9* 12.6*  HGB 13.4 10.5*  HCT 39.7 32.0*  PLT 193 168    Blood type: A POS Rubella: Immune    Physical Exam:  General:  alert, cooperative and no distress CV: Regular rate and rhythm Resp: clear Abdomen: soft, nontender, normal bowel sounds Uterine Fundus: firm, below umbilicus, nontender Perineum: healing with good reapproximation and mild edema Lochia: minimal Ext: Homans sign is negative, no sign of DVT and no edema, redness or tenderness in the calves or thighs    A/P: PPD # 2/ G1P1001/ S/P: VAVD with mediolateral episiotomy Doing well and stable for discharge home RX: Ibuprofen 600mg  po Q 6 hrs prn pain #30 Refill x 1 Niferex 150mg  po QD/BID #30/#60 Refill x 1 Colace 100mg  po up to TID prn #30 Ref x 1 WOB/GYN booklet given Routine pp visit in 6wks   Demetrius RevelFISHER,Nelline Lio K, MSN, [facility] SuarezWHNP 05/01/2013, 10:00 AM

## 2013-05-03 ENCOUNTER — Ambulatory Visit: Payer: Self-pay | Admitting: Pediatrics

## 2014-01-17 ENCOUNTER — Encounter (HOSPITAL_COMMUNITY): Payer: Self-pay

## 2017-03-12 ENCOUNTER — Ambulatory Visit: Payer: 59 | Admitting: Family Medicine

## 2017-03-12 ENCOUNTER — Encounter: Payer: Self-pay | Admitting: Family Medicine

## 2017-03-12 VITALS — BP 128/86 | HR 76 | Temp 98.4°F | Ht 63.0 in | Wt 177.7 lb

## 2017-03-12 DIAGNOSIS — R05 Cough: Secondary | ICD-10-CM

## 2017-03-12 DIAGNOSIS — R6884 Jaw pain: Secondary | ICD-10-CM | POA: Diagnosis not present

## 2017-03-12 DIAGNOSIS — J309 Allergic rhinitis, unspecified: Secondary | ICD-10-CM

## 2017-03-12 DIAGNOSIS — Z7689 Persons encountering health services in other specified circumstances: Secondary | ICD-10-CM

## 2017-03-12 DIAGNOSIS — R059 Cough, unspecified: Secondary | ICD-10-CM

## 2017-03-12 NOTE — Progress Notes (Signed)
BP 128/86 (BP Location: Right Arm, Patient Position: Sitting, Cuff Size: Normal)   Pulse 76   Temp 98.4 F (36.9 C) (Oral)   Ht 5\' 3"  (1.6 m)   Wt 177 lb 11.2 oz (80.6 kg)   SpO2 98%   BMI 31.48 kg/m    Subjective:    Patient ID: Tina Francis, female    DOB: 1985/07/06, 31 y.o.   MRN: [id number]  HPI: Tina Francis is a 31 y.o. female  Chief Complaint  Patient presents with  . Establish Care  . Jaw Pain    History of TMJ for 6 years. Comes and goes.    Patient presents today to establish care.   Main concerns today are an intermittent cough she gets several times a year that usually lasts at least a month and some jaw pain intermittently x 6 years. Notes her jaw tends to flare during the spells of coughing. Not currently dealing with either issue. Does have known hx of seasonal allergies, does not regularly take anything for that. Only remedy tried for jaw pain is heating packs. Uses cough lozenges with minimal relief for cough.   Mother passed away last year at 5949 of cardiac arrest, had a heart problem but pt not sure specifically what the problem was. Very concerned about her own cardiac health because of this event. No hx of CP, palpitations, SOB. No known cholesterol issues, has never smoked.   Past Medical History:  Diagnosis Date  . Gestational diabetes   . Infertility   . Vacuum extractor delivery, delivered 04/29/2013   Social History   Socioeconomic History  . Marital status: Married    Spouse name: Not on file  . Number of children: Not on file  . Years of education: Not on file  . Highest education level: Not on file  Social Needs  . Financial resource strain: Not on file  . Food insecurity - worry: Not on file  . Food insecurity - inability: Not on file  . Transportation needs - medical: Not on file  . Transportation needs - non-medical: Not on file  Occupational History  . Not on file  Tobacco Use  . Smoking status: Never Smoker  . Smokeless  tobacco: Never Used  Substance and Sexual Activity  . Alcohol use: No  . Drug use: No  . Sexual activity: Not on file  Other Topics Concern  . Not on file  Social History Narrative  . Not on file    Relevant past medical, surgical, family and social history reviewed and updated as indicated. Interim medical history since our last visit reviewed. Allergies and medications reviewed and updated.  Review of Systems  Constitutional: Negative.   HENT: Negative.   Eyes: Negative.   Respiratory: Positive for cough.   Cardiovascular: Negative.   Gastrointestinal: Negative.   Genitourinary: Negative.   Musculoskeletal:       Jaw pain - intermittent  Neurological: Negative.   Psychiatric/Behavioral: Negative.    Per HPI unless specifically indicated above     Objective:    BP 128/86 (BP Location: Right Arm, Patient Position: Sitting, Cuff Size: Normal)   Pulse 76   Temp 98.4 F (36.9 C) (Oral)   Ht 5\' 3"  (1.6 m)   Wt 177 lb 11.2 oz (80.6 kg)   SpO2 98%   BMI 31.48 kg/m   Wt Readings from Last 3 Encounters:  03/12/17 177 lb 11.2 oz (80.6 kg)  04/29/13 180 lb (81.6 kg)  04/28/10 156 lb (  70.8 kg)    Physical Exam  Constitutional: She is oriented to person, place, and time. She appears well-developed and well-nourished. No distress.  HENT:  Head: Atraumatic.  Right Ear: External ear normal.  Left Ear: External ear normal.  Nose: Nose normal.  Mouth/Throat: Oropharynx is clear and moist. No oropharyngeal exudate.  No TMJ ttp   Eyes: Conjunctivae are normal. Pupils are equal, round, and reactive to light.  Neck: Normal range of motion. Neck supple.  Cardiovascular: Normal rate, regular rhythm, normal heart sounds and intact distal pulses.  Pulmonary/Chest: Effort normal and breath sounds normal. No respiratory distress. She has no wheezes. She has no rales.  Musculoskeletal: Normal range of motion.  Lymphadenopathy:    She has no cervical adenopathy.  Neurological: She  is alert and oriented to person, place, and time.  Skin: Skin is warm and dry.  Psychiatric: She has a normal mood and affect. Her behavior is normal.  Nursing note and vitals reviewed.     Assessment & Plan:   Problem List Items Addressed This Visit      Respiratory   Allergic rhinitis    Recommended trial of daily zyrtec and flonase to see if her allergies are causing coughing spells lasting a month or more throughout the year.       Other Visit Diagnoses    Jaw pain    -  Primary   Intermittent, not currently active. Recommended mouthguard for sleep during flares, ibuprofen prn, heat, etc. F/u if worsening with dentist for further eval   Encounter to establish care       Cough       Suspect allergy related. Recommended long-term use of zyrtec and flonase regimen to monitor for benefit from intermittent cough. Delsym prn.        Follow up plan: Return for CPE as scheduled.

## 2017-03-14 ENCOUNTER — Encounter: Payer: Self-pay | Admitting: Family Medicine

## 2017-03-14 ENCOUNTER — Ambulatory Visit (INDEPENDENT_AMBULATORY_CARE_PROVIDER_SITE_OTHER): Payer: 59 | Admitting: Family Medicine

## 2017-03-14 VITALS — BP 113/76 | HR 75 | Temp 98.1°F | Ht 63.0 in | Wt 176.7 lb

## 2017-03-14 DIAGNOSIS — Z23 Encounter for immunization: Secondary | ICD-10-CM

## 2017-03-14 DIAGNOSIS — N926 Irregular menstruation, unspecified: Secondary | ICD-10-CM | POA: Diagnosis not present

## 2017-03-14 DIAGNOSIS — Z0000 Encounter for general adult medical examination without abnormal findings: Secondary | ICD-10-CM | POA: Diagnosis not present

## 2017-03-14 DIAGNOSIS — Z8742 Personal history of other diseases of the female genital tract: Secondary | ICD-10-CM

## 2017-03-14 NOTE — Assessment & Plan Note (Signed)
Recommended trial of daily zyrtec and flonase to see if her allergies are causing coughing spells lasting a month or more throughout the year.

## 2017-03-14 NOTE — Progress Notes (Signed)
BP 113/76 (BP Location: Right Arm, Patient Position: Sitting, Cuff Size: Normal)   Pulse 75   Temp 98.1 F (36.7 C) (Oral)   Ht 5\' 3"  (1.6 m)   Wt 176 lb 11.2 oz (80.2 kg)   SpO2 100%   BMI 31.30 kg/m    Subjective:    Patient ID: Tina Francis, female    DOB: 1985/07/12, 31 y.o.   MRN: [id number]  HPI: Tina AlconSaranya Secord is a 31 y.o. female presenting on 03/14/2017 for comprehensive medical examination. Current medical complaints include:see below  Period was 2 days late this month. Has not taken a home test yet. Not desiring pregnancy, but not on any type of contraception. Had to use metformin to conceive her first child.   She currently lives with: husband and child  Depression Screen done today and results listed below:  [facility] 03/14/2017 03/14/2017  Decreased Interest 0 0  Down, Depressed, Hopeless 0 0  PHQ - 2 Score 0 0  Altered sleeping 1 -  Tired, decreased energy 0 -  Change in appetite 0 -  Feeling bad or failure about yourself  0 -  Trouble concentrating 0 -  Moving slowly or fidgety/restless 0 -  Suicidal thoughts 0 -  PHQ-9 Score 1 -  Difficult doing work/chores Not difficult at all -    The patient does not have a history of falls. I did not complete a risk assessment for falls. A plan of care for falls was not documented.   Past Medical History:  Past Medical History:  Diagnosis Date  . Gestational diabetes   . Infertility   . Vacuum extractor delivery, delivered 04/29/2013    Surgical History:  Past Surgical History:  Procedure Laterality Date  . NO PAST SURGERIES      Medications:  No current outpatient medications on file prior to visit.   No current facility-administered medications on file prior to visit.     Allergies:  No Known Allergies  Social History:  Social History   Socioeconomic History  . Marital status: Married    Spouse name: Not on file  . Number of children: Not on file  . Years of education:  Not on file  . Highest education level: Not on file  Social Needs  . Financial resource strain: Not on file  . Food insecurity - worry: Not on file  . Food insecurity - inability: Not on file  . Transportation needs - medical: Not on file  . Transportation needs - non-medical: Not on file  Occupational History  . Not on file  Tobacco Use  . Smoking status: Never Smoker  . Smokeless tobacco: Never Used  Substance and Sexual Activity  . Alcohol use: No  . Drug use: No  . Sexual activity: Not on file  Other Topics Concern  . Not on file  Social History Narrative  . Not on file   Social History   Tobacco Use  Smoking Status Never Smoker  Smokeless Tobacco Never Used   Social History   Substance and Sexual Activity  Alcohol Use No    Family History:  Family History  Problem Relation Age of Onset  . Heart attack Mother   . Hypertension Mother   . Diabetes Maternal Grandmother     Past medical history, surgical history, medications, allergies, family history and social history reviewed with patient today and changes made to appropriate areas of the chart.   Review of Systems - General ROS: negative Psychological  ROS: negative Ophthalmic ROS: negative ENT ROS: negative Allergy and Immunology ROS: negative Breast ROS: negative for breast lumps Respiratory ROS: no cough, shortness of breath, or wheezing Cardiovascular ROS: no chest pain or dyspnea on exertion Gastrointestinal ROS: no abdominal pain, change in bowel habits, or black or bloody stools Genito-Urinary ROS: positive for - missed period Musculoskeletal ROS: negative Neurological ROS: no TIA or stroke symptoms Dermatological ROS: negative All other ROS negative except what is listed above and in the HPI.      Objective:    BP 113/76 (BP Location: Right Arm, Patient Position: Sitting, Cuff Size: Normal)   Pulse 75   Temp 98.1 F (36.7 C) (Oral)   Ht 5\' 3"  (1.6 m)   Wt 176 lb 11.2 oz (80.2 kg)   SpO2  100%   BMI 31.30 kg/m   Wt Readings from Last 3 Encounters:  03/14/17 176 lb 11.2 oz (80.2 kg)  03/12/17 177 lb 11.2 oz (80.6 kg)  04/29/13 180 lb (81.6 kg)    Physical Exam  Constitutional: She is oriented to person, place, and time. She appears well-developed and well-nourished. No distress.  HENT:  Head: Atraumatic.  Right Ear: External ear normal.  Left Ear: External ear normal.  Nose: Nose normal.  Mouth/Throat: Oropharynx is clear and moist. No oropharyngeal exudate.  Eyes: Conjunctivae are normal. Pupils are equal, round, and reactive to light. No scleral icterus.  Neck: Normal range of motion. Neck supple. No thyromegaly present.  Cardiovascular: Normal rate, regular rhythm, normal heart sounds and intact distal pulses.  Pulmonary/Chest: Effort normal and breath sounds normal. No respiratory distress. Right breast exhibits no mass, no skin change and no tenderness. Left breast exhibits no mass, no skin change and no tenderness.  Abdominal: Soft. Bowel sounds are normal. She exhibits no mass. There is no tenderness.  Genitourinary: Vagina normal and uterus normal. No vaginal discharge found.  Musculoskeletal: Normal range of motion. She exhibits no edema or tenderness.  Lymphadenopathy:    She has no cervical adenopathy.    She has no axillary adenopathy.  Neurological: She is alert and oriented to person, place, and time. No cranial nerve deficit.  Skin: Skin is warm and dry. No rash noted.  Psychiatric: She has a normal mood and affect. Her behavior is normal.  Nursing note and vitals reviewed.  Results for orders placed or performed in visit on 03/14/17  CBC with Differential/Platelet  Result Value Ref Range   WBC 9.8 3.4 - 10.8 x10E3/uL   RBC 4.94 3.77 - 5.28 x10E6/uL   Hemoglobin 12.3 11.1 - 15.9 g/dL   Hematocrit 40.938.7 81.134.0 - 46.6 %   MCV 78 (L) 79 - 97 fL   MCH 24.9 (L) 26.6 - 33.0 pg   MCHC 31.8 31.5 - 35.7 g/dL   RDW 91.415.1 78.212.3 - 95.615.4 %   Platelets 254 150 -  379 x10E3/uL   Neutrophils 70 Not Estab. %   Lymphs 23 Not Estab. %   Monocytes 6 Not Estab. %   Eos 1 Not Estab. %   Basos 0 Not Estab. %   Neutrophils Absolute 6.8 1.4 - 7.0 x10E3/uL   Lymphocytes Absolute 2.3 0.7 - 3.1 x10E3/uL   Monocytes Absolute 0.6 0.1 - 0.9 x10E3/uL   EOS (ABSOLUTE) 0.1 0.0 - 0.4 x10E3/uL   Basophils Absolute 0.0 0.0 - 0.2 x10E3/uL   Immature Granulocytes 0 Not Estab. %   Immature Grans (Abs) 0.0 0.0 - 0.1 x10E3/uL  Comprehensive metabolic panel  Result Value  Ref Range   Glucose 82 65 - 99 mg/dL   BUN 7 6 - 20 mg/dL   Creatinine, Ser 6.04 0.57 - 1.00 mg/dL   GFR calc non Af Amer 111 >59 mL/min/1.73   GFR calc Af Amer 128 >59 mL/min/1.73   BUN/Creatinine Ratio 10 9 - 23   Sodium 138 134 - 144 mmol/L   Potassium 3.6 3.5 - 5.2 mmol/L   Chloride 100 96 - 106 mmol/L   CO2 23 20 - 29 mmol/L   Calcium 9.2 8.7 - 10.2 mg/dL   Total Protein 8.2 6.0 - 8.5 g/dL   Albumin 4.6 3.5 - 5.5 g/dL   Globulin, Total 3.6 1.5 - 4.5 g/dL   Albumin/Globulin Ratio 1.3 1.2 - 2.2   Bilirubin Total 0.3 0.0 - 1.2 mg/dL   Alkaline Phosphatase 90 39 - 117 IU/L   AST 13 0 - 40 IU/L   ALT 10 0 - 32 IU/L  Lipid Panel w/o Chol/HDL Ratio  Result Value Ref Range   Cholesterol, Total 192 100 - 199 mg/dL   Triglycerides 540 0 - 149 mg/dL   HDL 43 >98 mg/dL   VLDL Cholesterol Cal 27 5 - 40 mg/dL   LDL Calculated 119 (H) 0 - 99 mg/dL  TSH  Result Value Ref Range   TSH 2.730 0.450 - 4.500 uIU/mL      Assessment & Plan:   Problem List Items Addressed This Visit    None    Visit Diagnoses    Missed period    -  Primary   Await urine pregnancy. Recommended a prenatal vitamin daily while not on contraception despite past fertility problems just in case   Relevant Orders   Pregnancy, urine   Annual physical exam       Relevant Orders   CBC with Differential/Platelet (Completed)   Comprehensive metabolic panel (Completed)   Lipid Panel w/o Chol/HDL Ratio (Completed)   TSH  (Completed)   UA/M w/rflx Culture, Routine   Need for Tdap vaccination       Relevant Orders   Tdap vaccine greater than or equal to 7yo IM (Completed)   Need for influenza vaccination       Relevant Orders   Flu Vaccine QUAD 6+ mos PF IM (Fluarix Quad PF) (Completed)   History of abnormal cervical Pap smear       Relevant Orders   IGP, Aptima HPV, rfx 16/18,45       Follow up plan: Return in about 1 year (around 03/14/2018) for CPE.   LABORATORY TESTING:  - Pap smear: pap done  IMMUNIZATIONS:   - Tdap: Tetanus vaccination status reviewed: last vaccine date unknown - given today. - Influenza: Administered today  PATIENT COUNSELING:   Advised to take 1 mg of folate supplement per day if capable of pregnancy.   Sexuality: Discussed sexually transmitted diseases, partner selection, use of condoms, avoidance of unintended pregnancy  and contraceptive alternatives.   Advised to avoid cigarette smoking.  I discussed with the patient that most people either abstain from alcohol or drink within safe limits (<=14/week and <=4 drinks/occasion for males, <=7/weeks and <= 3 drinks/occasion for females) and that the risk for alcohol disorders and other health effects rises proportionally with the number of drinks per week and how often a drinker exceeds daily limits.  Discussed cessation/primary prevention of drug use and availability of treatment for abuse.   Diet: Encouraged to adjust caloric intake to maintain  or achieve ideal body weight, to  reduce intake of dietary saturated fat and total fat, to limit sodium intake by avoiding high sodium foods and not adding table salt, and to maintain adequate dietary potassium and calcium preferably from fresh fruits, vegetables, and low-fat dairy products.    stressed the importance of regular exercise  Injury prevention: Discussed safety belts, safety helmets, smoke detector, smoking near bedding or upholstery.   Dental health: Discussed  importance of regular tooth brushing, flossing, and dental visits.    NEXT PREVENTATIVE PHYSICAL DUE IN 1 YEAR. Return in about 1 year (around 03/14/2018) for CPE.

## 2017-03-14 NOTE — Patient Instructions (Signed)
Follow up as scheduled.  

## 2017-03-14 NOTE — Patient Instructions (Signed)
Influenza (Flu) Vaccine (Inactivated or Recombinant): What You Need to Know 1. Why get vaccinated? Influenza ("flu") is a contagious disease that spreads around the [place] every year, usually between October and May. Flu is caused by influenza viruses, and is spread mainly by coughing, sneezing, and close contact. Anyone can get flu. Flu strikes suddenly and can last several days. Symptoms vary by age, but can include:  fever/chills  sore throat  muscle aches  fatigue  cough  headache  runny or stuffy nose  Flu can also lead to pneumonia and blood infections, and cause diarrhea and seizures in children. If you have a medical condition, such as heart or lung disease, flu can make it worse. Flu is more dangerous for some people. Infants and young children, people 65 years of age and older, pregnant women, and people with certain health conditions or a weakened immune system are at greatest risk. Each year thousands of people in the [place] die from flu, and many more are hospitalized. Flu vaccine can:  keep you from getting flu,  make flu less severe if you do get it, and  keep you from spreading flu to your family and other people. 2. Inactivated and recombinant flu vaccines A dose of flu vaccine is recommended every flu season. Children 6 months through 8 years of age may need two doses during the same flu season. Everyone else needs only one dose each flu season. Some inactivated flu vaccines contain a very small amount of a mercury-based preservative called thimerosal. Studies have not shown thimerosal in vaccines to be harmful, but flu vaccines that do not contain thimerosal are available. There is no live flu virus in flu shots. They cannot cause the flu. There are many flu viruses, and they are always changing. Each year a new flu vaccine is made to protect against three or four viruses that are likely to cause disease in the upcoming flu season. But even when the  vaccine doesn't exactly match these viruses, it may still provide some protection. Flu vaccine cannot prevent:  flu that is caused by a virus not covered by the vaccine, or  illnesses that look like flu but are not.  It takes about 2 weeks for protection to develop after vaccination, and protection lasts through the flu season. 3. Some people should not get this vaccine Tell the person who is giving you the vaccine:  If you have any severe, life-threatening allergies. If you ever had a life-threatening allergic reaction after a dose of flu vaccine, or have a severe allergy to any part of this vaccine, you may be advised not to get vaccinated. Most, but not all, types of flu vaccine contain a small amount of egg protein.  If you ever had Guillain-Barr Syndrome (also called GBS). Some people with a history of GBS should not get this vaccine. This should be discussed with your doctor.  If you are not feeling well. It is usually okay to get flu vaccine when you have a mild illness, but you might be asked to come back when you feel better.  4. Risks of a vaccine reaction With any medicine, including vaccines, there is a chance of reactions. These are usually mild and go away on their own, but serious reactions are also possible. Most people who get a flu shot do not have any problems with it. Minor problems following a flu shot include:  soreness, redness, or swelling where the shot was given  hoarseness  sore,   red or itchy eyes  cough  fever  aches  headache  itching  fatigue  If these problems occur, they usually begin soon after the shot and last 1 or 2 days. More serious problems following a flu shot can include the following:  There may be a small increased risk of Guillain-Barre Syndrome (GBS) after inactivated flu vaccine. This risk has been estimated at 1 or 2 additional cases per million people vaccinated. This is much lower than the risk of severe complications from  flu, which can be prevented by flu vaccine.  Young children who get the flu shot along with pneumococcal vaccine (PCV13) and/or DTaP vaccine at the same time might be slightly more likely to have a seizure caused by fever. Ask your doctor for more information. Tell your doctor if a child who is getting flu vaccine has ever had a seizure.  Problems that could happen after any injected vaccine:  People sometimes faint after a medical procedure, including vaccination. Sitting or lying down for about 15 minutes can help prevent fainting, and injuries caused by a fall. Tell your doctor if you feel dizzy, or have vision changes or ringing in the ears.  Some people get severe pain in the shoulder and have difficulty moving the arm where a shot was given. This happens very rarely.  Any medication can cause a severe allergic reaction. Such reactions from a vaccine are very rare, estimated at about 1 in a million doses, and would happen within a few minutes to a few hours after the vaccination. As with any medicine, there is a very remote chance of a vaccine causing a serious injury or death. The safety of vaccines is always being monitored. For more information, visit: www.cdc.gov/vaccinesafety/ 5. What if there is a serious reaction? What should I look for? Look for anything that concerns you, such as signs of a severe allergic reaction, very high fever, or unusual behavior. Signs of a severe allergic reaction can include hives, swelling of the face and throat, difficulty breathing, a fast heartbeat, dizziness, and weakness. These would start a few minutes to a few hours after the vaccination. What should I do?  If you think it is a severe allergic reaction or other emergency that can't wait, call 9-1-1 and get the person to the nearest hospital. Otherwise, call your doctor.  Reactions should be reported to the Vaccine Adverse Event Reporting System (VAERS). Your doctor should file this report, or you  can do it yourself through the VAERS web site at www.vaers.hhs.gov, or by calling 1-800-822-7967. ? VAERS does not give medical advice. 6. The National Vaccine Injury Compensation Program The National Vaccine Injury Compensation Program (VICP) is a federal program that was created to compensate people who may have been injured by certain vaccines. Persons who believe they may have been injured by a vaccine can learn about the program and about filing a claim by calling 1-800-338-2382 or visiting the VICP website at www.hrsa.gov/vaccinecompensation. There is a time limit to file a claim for compensation. 7. How can I learn more?  Ask your healthcare provider. He or she can give you the vaccine package insert or suggest other sources of information.  Call your local or state health department.  Contact the Centers for Disease Control and Prevention (CDC): ? Call 1-800-232-4636 (1-800-CDC-INFO) or ? Visit CDC's website at www.cdc.gov/flu Vaccine Information Statement, Inactivated Influenza Vaccine (10/22/2013) This information is not intended to replace advice given to you by your health care provider. Make sure   you discuss any questions you have with your health care provider. Document Released: 12/27/2005 Document Revised: 11/23/2015 Document Reviewed: 11/23/2015 Elsevier Interactive Patient Education  2017 [employer] Tdap Vaccine (Tetanus, Diphtheria and Pertussis): What You Need to Know 1. Why get vaccinated? Tetanus, diphtheria and pertussis are very serious diseases. Tdap vaccine can protect us from these diseases. And, Tdap vaccine given to pregnant women can protect newborn babies against pertussis. TETANUS (Lockjaw) is rare in the [place] today. It causes painful muscle tightening and stiffness, usually all over the body.  It can lead to tightening of muscles in the head and neck so you can't open your mouth, swallow, or sometimes even breathe. Tetanus kills about 1 out of 10  people who are infected even after receiving the best medical care.  DIPHTHERIA is also rare in the [place] today. It can cause a thick coating to form in the back of the throat.  It can lead to breathing problems, heart failure, paralysis, and death.  PERTUSSIS (Whooping Cough) causes severe coughing spells, which can cause difficulty breathing, vomiting and disturbed sleep.  It can also lead to weight loss, incontinence, and rib fractures. Up to 2 in 100 adolescents and 5 in 100 adults with pertussis are hospitalized or have complications, which could include pneumonia or death.  These diseases are caused by bacteria. Diphtheria and pertussis are spread from person to person through secretions from coughing or sneezing. Tetanus enters the body through cuts, scratches, or wounds. Before vaccines, as many as 200,000 cases of diphtheria, 200,000 cases of pertussis, and hundreds of cases of tetanus, were reported in the [place] each year. Since vaccination began, reports of cases for tetanus and diphtheria have dropped by about 99% and for pertussis by about 80%. 2. Tdap vaccine Tdap vaccine can protect adolescents and adults from tetanus, diphtheria, and pertussis. One dose of Tdap is routinely given at age 11 or 12. People who did not get Tdap at that age should get it as soon as possible. Tdap is especially important for healthcare professionals and anyone having close contact with a baby younger than 12 months. Pregnant women should get a dose of Tdap during every pregnancy, to protect the newborn from pertussis. Infants are most at risk for severe, life-threatening complications from pertussis. Another vaccine, called Td, protects against tetanus and diphtheria, but not pertussis. A Td booster should be given every 10 years. Tdap may be given as one of these boosters if you have never gotten Tdap before. Tdap may also be given after a severe cut or burn to prevent tetanus  infection. Your doctor or the person giving you the vaccine can give you more information. Tdap may safely be given at the same time as other vaccines. 3. Some people should not get this vaccine  A person who has ever had a life-threatening allergic reaction after a previous dose of any diphtheria, tetanus or pertussis containing vaccine, OR has a severe allergy to any part of this vaccine, should not get Tdap vaccine. Tell the person giving the vaccine about any severe allergies.  Anyone who had coma or long repeated seizures within 7 days after a childhood dose of DTP or DTaP, or a previous dose of Tdap, should not get Tdap, unless a cause other than the vaccine was found. They can still get Td.  Talk to your doctor if you: ? have seizures or another nervous system problem, ? had severe pain or swelling after any vaccine containing diphtheria,   tetanus or pertussis, ? ever had a condition called Guillain-Barr Syndrome (GBS), ? aren't feeling well on the day the shot is scheduled. 4. Risks With any medicine, including vaccines, there is a chance of side effects. These are usually mild and go away on their own. Serious reactions are also possible but are rare. Most people who get Tdap vaccine do not have any problems with it. Mild problems following Tdap: (Did not interfere with activities)  Pain where the shot was given (about 3 in 4 adolescents or 2 in 3 adults)  Redness or swelling where the shot was given (about 1 person in 5)  Mild fever of at least 100.4F (up to about 1 in 25 adolescents or 1 in 100 adults)  Headache (about 3 or 4 people in 10)  Tiredness (about 1 person in 3 or 4)  Nausea, vomiting, diarrhea, stomach ache (up to 1 in 4 adolescents or 1 in 10 adults)  Chills, sore joints (about 1 person in 10)  Body aches (about 1 person in 3 or 4)  Rash, swollen glands (uncommon)  Moderate problems following Tdap: (Interfered with activities, but did not require medical  attention)  Pain where the shot was given (up to 1 in 5 or 6)  Redness or swelling where the shot was given (up to about 1 in 16 adolescents or 1 in 12 adults)  Fever over 102F (about 1 in 100 adolescents or 1 in 250 adults)  Headache (about 1 in 7 adolescents or 1 in 10 adults)  Nausea, vomiting, diarrhea, stomach ache (up to 1 or 3 people in 100)  Swelling of the entire arm where the shot was given (up to about 1 in 500).  Severe problems following Tdap: (Unable to perform usual activities; required medical attention)  Swelling, severe pain, bleeding and redness in the arm where the shot was given (rare).  Problems that could happen after any vaccine:  People sometimes faint after a medical procedure, including vaccination. Sitting or lying down for about 15 minutes can help prevent fainting, and injuries caused by a fall. Tell your doctor if you feel dizzy, or have vision changes or ringing in the ears.  Some people get severe pain in the shoulder and have difficulty moving the arm where a shot was given. This happens very rarely.  Any medication can cause a severe allergic reaction. Such reactions from a vaccine are very rare, estimated at fewer than 1 in a million doses, and would happen within a few minutes to a few hours after the vaccination. As with any medicine, there is a very remote chance of a vaccine causing a serious injury or death. The safety of vaccines is always being monitored. For more information, visit: www.cdc.gov/vaccinesafety/ 5. What if there is a serious problem? What should I look for? Look for anything that concerns you, such as signs of a severe allergic reaction, very high fever, or unusual behavior. Signs of a severe allergic reaction can include hives, swelling of the face and throat, difficulty breathing, a fast heartbeat, dizziness, and weakness. These would usually start a few minutes to a few hours after the vaccination. What should I do?  If  you think it is a severe allergic reaction or other emergency that can't wait, call 9-1-1 or get the person to the nearest hospital. Otherwise, call your doctor.  Afterward, the reaction should be reported to the Vaccine Adverse Event Reporting System (VAERS). Your doctor might file this report, or you can do   it yourself through the VAERS web site at www.vaers.hhs.gov, or by calling 1-800-822-7967. ? VAERS does not give medical advice. 6. The National Vaccine Injury Compensation Program The National Vaccine Injury Compensation Program (VICP) is a federal program that was created to compensate people who may have been injured by certain vaccines. Persons who believe they may have been injured by a vaccine can learn about the program and about filing a claim by calling 1-800-338-2382 or visiting the VICP website at www.hrsa.gov/vaccinecompensation. There is a time limit to file a claim for compensation. 7. How can I learn more?  Ask your doctor. He or she can give you the vaccine package insert or suggest other sources of information.  Call your local or state health department.  Contact the Centers for Disease Control and Prevention (CDC): ? Call 1-800-232-4636 (1-800-CDC-INFO) or ? Visit CDC's website at www.cdc.gov/vaccines CDC Tdap Vaccine VIS (05/11/13) This information is not intended to replace advice given to you by your health care provider. Make sure you discuss any questions you have with your health care provider. Document Released: 09/03/2011 Document Revised: 11/23/2015 Document Reviewed: 11/23/2015 Elsevier Interactive Patient Education  2017 Elsevier Inc.  

## 2017-03-15 LAB — COMPREHENSIVE METABOLIC PANEL
A/G RATIO: 1.3 (ref 1.2–2.2)
ALBUMIN: 4.6 g/dL (ref 3.5–5.5)
ALK PHOS: 90 IU/L (ref 39–117)
ALT: 10 IU/L (ref 0–32)
AST: 13 IU/L (ref 0–40)
BUN / CREAT RATIO: 10 (ref 9–23)
BUN: 7 mg/dL (ref 6–20)
Bilirubin Total: 0.3 mg/dL (ref 0.0–1.2)
CHLORIDE: 100 mmol/L (ref 96–106)
CO2: 23 mmol/L (ref 20–29)
Calcium: 9.2 mg/dL (ref 8.7–10.2)
Creatinine, Ser: 0.73 mg/dL (ref 0.57–1.00)
GFR calc Af Amer: 128 mL/min/{1.73_m2} (ref >59)
GFR calc non Af Amer: 111 mL/min/{1.73_m2} (ref >59)
Globulin, Total: 3.6 g/dL (ref 1.5–4.5)
Glucose: 82 mg/dL (ref 65–99)
POTASSIUM: 3.6 mmol/L (ref 3.5–5.2)
Sodium: 138 mmol/L (ref 134–144)
Total Protein: 8.2 g/dL (ref 6.0–8.5)

## 2017-03-15 LAB — CBC WITH DIFFERENTIAL/PLATELET
BASOS ABS: 0 10*3/uL (ref 0.0–0.2)
BASOS: 0 %
EOS (ABSOLUTE): 0.1 10*3/uL (ref 0.0–0.4)
Eos: 1 %
Hematocrit: 38.7 % (ref 34.0–46.6)
Hemoglobin: 12.3 g/dL (ref 11.1–15.9)
Immature Grans (Abs): 0 10*3/uL (ref 0.0–0.1)
Immature Granulocytes: 0 %
LYMPHS ABS: 2.3 10*3/uL (ref 0.7–3.1)
Lymphs: 23 %
MCH: 24.9 pg — AB (ref 26.6–33.0)
MCHC: 31.8 g/dL (ref 31.5–35.7)
MCV: 78 fL — AB (ref 79–97)
Monocytes Absolute: 0.6 10*3/uL (ref 0.1–0.9)
Monocytes: 6 %
NEUTROS ABS: 6.8 10*3/uL (ref 1.4–7.0)
Neutrophils: 70 %
PLATELETS: 254 10*3/uL (ref 150–379)
RBC: 4.94 x10E6/uL (ref 3.77–5.28)
RDW: 15.1 % (ref 12.3–15.4)
WBC: 9.8 10*3/uL (ref 3.4–10.8)

## 2017-03-15 LAB — LIPID PANEL W/O CHOL/HDL RATIO
CHOLESTEROL TOTAL: 192 mg/dL (ref 100–199)
HDL: 43 mg/dL (ref >39)
LDL Calculated: 122 mg/dL — ABNORMAL HIGH (ref 0–99)
Triglycerides: 134 mg/dL (ref 0–149)
VLDL Cholesterol Cal: 27 mg/dL (ref 5–40)

## 2017-03-15 LAB — TSH: TSH: 2.73 u[IU]/mL (ref 0.450–4.500)

## 2017-03-17 LAB — IGP, APTIMA HPV, RFX 16/18,45
HPV APTIMA: NEGATIVE
PAP Smear Comment: 0

## 2017-03-17 LAB — MICROSCOPIC EXAMINATION

## 2017-03-17 LAB — UA/M W/RFLX CULTURE, ROUTINE
BILIRUBIN UA: NEGATIVE
GLUCOSE, UA: NEGATIVE
Ketones, UA: NEGATIVE
Nitrite, UA: NEGATIVE
PH UA: 5.5 (ref 5.0–7.5)
PROTEIN UA: NEGATIVE
RBC, UA: NEGATIVE
Specific Gravity, UA: 1.015 (ref 1.005–1.030)
UUROB: 0.2 mg/dL (ref 0.2–1.0)

## 2017-03-17 LAB — PREGNANCY, URINE: Preg Test, Ur: NEGATIVE

## 2017-04-14 ENCOUNTER — Ambulatory Visit: Payer: Managed Care, Other (non HMO) | Admitting: Family Medicine

## 2017-04-14 ENCOUNTER — Encounter: Payer: Self-pay | Admitting: Family Medicine

## 2017-04-14 VITALS — BP 117/79 | HR 89 | Temp 98.4°F | Wt 178.8 lb

## 2017-04-14 DIAGNOSIS — R221 Localized swelling, mass and lump, neck: Secondary | ICD-10-CM | POA: Diagnosis not present

## 2017-04-14 NOTE — Progress Notes (Signed)
BP 117/79 (BP Location: Right Arm, Patient Position: Sitting, Cuff Size: Normal)   Pulse 89   Temp 98.4 F (36.9 C) (Oral)   Wt 178 lb 12.8 oz (81.1 kg)   SpO2 99%   BMI 31.67 kg/m    Subjective:    Patient ID: Tina Francis, female    DOB: 01-Sep-1985, 32 y.o.   MRN: [id number]  HPI: Tina Francis is a 32 y.o. female  Chief Complaint  Patient presents with  . Lymphadenopathy    Patient states she had pain in her lympnode this weekend and it has became worse. Right side.   About a week of sore, swollen place in right neck near midline. Worse seemingly when she drinks cold drinks. Seems to come and go intermittently the past few months. States she's not able to swallow properly, dull pain but that food doesn't catch necessarily. Denies fevers, weight loss, night sweats, recent illness, hx of thyroid issues.   Relevant past medical, surgical, family and social history reviewed and updated as indicated. Interim medical history since our last visit reviewed. Allergies and medications reviewed and updated.  Review of Systems  Constitutional: Negative.   HENT: Positive for trouble swallowing.        Neck swelling and tenderness, right  Respiratory: Negative.  Negative for stridor.   Cardiovascular: Negative.   Gastrointestinal: Negative.   Genitourinary: Negative.   Skin: Negative.   Neurological: Negative.   Psychiatric/Behavioral: Negative.    Per HPI unless specifically indicated above     Objective:    BP 117/79 (BP Location: Right Arm, Patient Position: Sitting, Cuff Size: Normal)   Pulse 89   Temp 98.4 F (36.9 C) (Oral)   Wt 178 lb 12.8 oz (81.1 kg)   SpO2 99%   BMI 31.67 kg/m   Wt Readings from Last 3 Encounters:  04/14/17 178 lb 12.8 oz (81.1 kg)  03/14/17 176 lb 11.2 oz (80.2 kg)  03/12/17 177 lb 11.2 oz (80.6 kg)    Physical Exam  Constitutional: She is oriented to person, place, and time. She appears well-developed and well-nourished. No  distress.  HENT:  Head: Atraumatic.  Eyes: Conjunctivae are normal. Pupils are equal, round, and reactive to light.  Neck: Normal range of motion. Neck supple. No tracheal deviation present. No thyromegaly present.  Minimally palpable fullness to the right of midline at mid-neck, does not appear to be related to thyroid, unclear if lymphatic tissue. Mildly ttp over area. Pt swallowing normally  Cardiovascular: Normal rate and normal heart sounds.  Pulmonary/Chest: Effort normal and breath sounds normal. No stridor.  Musculoskeletal: Normal range of motion.  Neurological: She is alert and oriented to person, place, and time.  Skin: Skin is warm and dry. No erythema.  Psychiatric: She has a normal mood and affect. Her behavior is normal.  Nursing note and vitals reviewed.  Results for orders placed or performed in visit on 03/14/17  Microscopic Examination  Result Value Ref Range   WBC, UA 6-10 (A) 0 - 5 /hpf   RBC, UA 0-2 0 - 2 /hpf   Epithelial Cells (non renal) 0-10 0 - 10 /hpf   Bacteria, UA Few None seen/Few  Pregnancy, urine  Result Value Ref Range   Preg Test, Ur Negative Negative  CBC with Differential/Platelet  Result Value Ref Range   WBC 9.8 3.4 - 10.8 x10E3/uL   RBC 4.94 3.77 - 5.28 x10E6/uL   Hemoglobin 12.3 11.1 - 15.9 g/dL   Hematocrit 40.9 81.1 -  46.6 %   MCV 78 (L) 79 - 97 fL   MCH 24.9 (L) 26.6 - 33.0 pg   MCHC 31.8 31.5 - 35.7 g/dL   RDW 16.1 09.6 - 04.5 %   Platelets 254 150 - 379 x10E3/uL   Neutrophils 70 Not Estab. %   Lymphs 23 Not Estab. %   Monocytes 6 Not Estab. %   Eos 1 Not Estab. %   Basos 0 Not Estab. %   Neutrophils Absolute 6.8 1.4 - 7.0 x10E3/uL   Lymphocytes Absolute 2.3 0.7 - 3.1 x10E3/uL   Monocytes Absolute 0.6 0.1 - 0.9 x10E3/uL   EOS (ABSOLUTE) 0.1 0.0 - 0.4 x10E3/uL   Basophils Absolute 0.0 0.0 - 0.2 x10E3/uL   Immature Granulocytes 0 Not Estab. %   Immature Grans (Abs) 0.0 0.0 - 0.1 x10E3/uL  Comprehensive metabolic panel  Result  Value Ref Range   Glucose 82 65 - 99 mg/dL   BUN 7 6 - 20 mg/dL   Creatinine, Ser 4.09 0.57 - 1.00 mg/dL   GFR calc non Af Amer 111 >59 mL/min/1.73   GFR calc Af Amer 128 >59 mL/min/1.73   BUN/Creatinine Ratio 10 9 - 23   Sodium 138 134 - 144 mmol/L   Potassium 3.6 3.5 - 5.2 mmol/L   Chloride 100 96 - 106 mmol/L   CO2 23 20 - 29 mmol/L   Calcium 9.2 8.7 - 10.2 mg/dL   Total Protein 8.2 6.0 - 8.5 g/dL   Albumin 4.6 3.5 - 5.5 g/dL   Globulin, Total 3.6 1.5 - 4.5 g/dL   Albumin/Globulin Ratio 1.3 1.2 - 2.2   Bilirubin Total 0.3 0.0 - 1.2 mg/dL   Alkaline Phosphatase 90 39 - 117 IU/L   AST 13 0 - 40 IU/L   ALT 10 0 - 32 IU/L  Lipid Panel w/o Chol/HDL Ratio  Result Value Ref Range   Cholesterol, Total 192 100 - 199 mg/dL   Triglycerides 811 0 - 149 mg/dL   HDL 43 >91 mg/dL   VLDL Cholesterol Cal 27 5 - 40 mg/dL   LDL Calculated 478 (H) 0 - 99 mg/dL  TSH  Result Value Ref Range   TSH 2.730 0.450 - 4.500 uIU/mL  UA/M w/rflx Culture, Routine  Result Value Ref Range   Specific Gravity, UA 1.015 1.005 - 1.030   pH, UA 5.5 5.0 - 7.5   Color, UA Yellow Yellow   Appearance Ur Clear Clear   Leukocytes, UA Trace (A) Negative   Protein, UA Negative Negative/Trace   Glucose, UA Negative Negative   Ketones, UA Negative Negative   RBC, UA Negative Negative   Bilirubin, UA Negative Negative   Urobilinogen, Ur 0.2 0.2 - 1.0 mg/dL   Nitrite, UA Negative Negative   Microscopic Examination See below:   IGP, Aptima HPV, rfx 16/18,45  Result Value Ref Range   DIAGNOSIS: Comment    Specimen adequacy: Comment    Clinician Provided ICD10 Comment    Performed by: Comment    PAP Smear Comment .    Note: Comment    Test Methodology Comment    HPV Aptima Negative Negative      Assessment & Plan:   Problem List Items Addressed This Visit    None    Visit Diagnoses    Neck swelling    -  Primary   Could be related to post-nasal drainage from allergies, unclear at this time. Will get neck  u/s and try to isolate any abnormalities, await  results   Relevant Orders   US Soft Tissue Head/Neck    Return precautions reviewed including dysphagia to solids or liquids, stridor, fevers, etc.    Follow up plan: Return if symptoms worsen or fail to improve.

## 2017-04-15 NOTE — Patient Instructions (Signed)
Follow up as needed

## 2017-04-17 ENCOUNTER — Ambulatory Visit
Admission: RE | Admit: 2017-04-17 | Discharge: 2017-04-17 | Disposition: A | Discharge status: Home / Self Care | Payer: Managed Care, Other (non HMO) | Source: Ambulatory Visit | Attending: Family Medicine | Admitting: Family Medicine

## 2017-04-17 DIAGNOSIS — R591 Generalized enlarged lymph nodes: Secondary | ICD-10-CM | POA: Diagnosis not present

## 2017-04-17 DIAGNOSIS — R221 Localized swelling, mass and lump, neck: Secondary | ICD-10-CM | POA: Diagnosis present

## 2017-04-18 ENCOUNTER — Telehealth: Payer: Self-pay

## 2017-04-18 MED ORDER — CETIRIZINE HCL 10 MG PO TABS: 10.0000 mg | ORAL_TABLET | Freq: Every day | ORAL | 11 refills | Status: DC

## 2017-04-18 MED ORDER — FLUTICASONE PROPIONATE 50 MCG/ACT NA SUSP: 2.0000 | Freq: Two times a day (BID) | NASAL | 11 refills | Status: DC

## 2017-04-18 NOTE — Telephone Encounter (Signed)
Patient would like not the lab result, but result of U/S

## 2017-04-18 NOTE — Telephone Encounter (Signed)
Called pt, discussed mildly inflamed lymph nodes shown on U/S in area of concern. Likely reactive, and pt with a known hx of allergies not currently on medications. Discussed options with patient, who would like to get on regular allergy regimen and monitor over the next 6 months. Will repeat U/S at that time if worsening or still bothersome to patient. Allergy medications sent over.

## 2017-04-18 NOTE — Telephone Encounter (Signed)
Copied from CRM [id number]. Topic: Inquiry >> Apr 18, 2017  1:11 PM Landry MellowFoltz, Melissa J wrote: Reason for CRM: pt is calling to see if lab test is complete. Please call [phone] or [phone]

## 2017-06-04 ENCOUNTER — Telehealth: Payer: Self-pay | Admitting: Family Medicine

## 2017-06-04 NOTE — Telephone Encounter (Signed)
Copied from CRM [id number]. Topic: Quick Communication - Rx Refill/Question >> Jun 04, 2017  4:30 PM Landry MellowFoltz, Melissa J wrote: Medication: yeast infection medication    Has the patient contacted their pharmacy? No.   (Agent: If no, request that the patient contact the pharmacy for the refill.)   Preferred Pharmacy (with phone number or street name): [facility] [place], [address]    Agent: Please be advised that RX refills may take up to 3 business days. We ask that you follow-up with your pharmacy.

## 2017-06-05 NOTE — Telephone Encounter (Signed)
Spoke with pt. States she went to [facility] 1 week ago and was put on antibiotic for upper respiratory infection. She now has a yeast infection and [facility] Instructed she needs OV,but wants to ask provider first.

## 2017-06-05 NOTE — Telephone Encounter (Signed)
Attempted to contact pt to discuss symptoms; left message on voice mail [phone].

## 2017-06-05 NOTE — Telephone Encounter (Signed)
Left message to call back to discuss symptoms.

## 2017-06-06 MED ORDER — FLUCONAZOLE 150 MG PO TABS: 150.0000 mg | ORAL_TABLET | Freq: Once | ORAL | 0 refills | Status: AC

## 2017-06-06 NOTE — Telephone Encounter (Signed)
Patient notified

## 2017-06-06 NOTE — Telephone Encounter (Signed)
Rx sent, if no better will need a visit

## 2017-06-06 NOTE — Addendum Note (Signed)
Addended by: Raechel AcheLANE, RACHEL E on: 06/06/2017 08:34 AM   Modules accepted: Orders

## 2017-12-24 ENCOUNTER — Ambulatory Visit: Payer: Managed Care, Other (non HMO) | Admitting: Family Medicine

## 2017-12-26 ENCOUNTER — Ambulatory Visit: Payer: Managed Care, Other (non HMO) | Admitting: Family Medicine

## 2017-12-26 ENCOUNTER — Encounter: Payer: Self-pay | Admitting: Family Medicine

## 2017-12-26 VITALS — BP 121/77 | HR 74 | Temp 98.7°F | Ht 63.0 in | Wt 179.9 lb

## 2017-12-26 DIAGNOSIS — Z6831 Body mass index (BMI) 31.0-31.9, adult: Secondary | ICD-10-CM

## 2017-12-26 DIAGNOSIS — E6609 Other obesity due to excess calories: Secondary | ICD-10-CM | POA: Diagnosis not present

## 2017-12-26 DIAGNOSIS — Z23 Encounter for immunization: Secondary | ICD-10-CM | POA: Diagnosis not present

## 2017-12-26 DIAGNOSIS — E282 Polycystic ovarian syndrome: Secondary | ICD-10-CM | POA: Diagnosis not present

## 2017-12-26 DIAGNOSIS — E669 Obesity, unspecified: Secondary | ICD-10-CM | POA: Insufficient documentation

## 2017-12-26 DIAGNOSIS — L680 Hirsutism: Secondary | ICD-10-CM | POA: Diagnosis not present

## 2017-12-26 NOTE — Progress Notes (Signed)
BP 121/77   Pulse 74   Temp 98.7 F (37.1 C) (Oral)   Ht 5\' 3"  (1.6 m)   Wt 179 lb 14.4 oz (81.6 kg)   SpO2 99%   BMI 31.87 kg/m    Subjective:    Patient ID: Tina Francis, female    DOB: [birth year], 32 y.o.   MRN: [id number]  HPI: Tina Francis is a 32 y.o. female  Chief Complaint  Patient presents with  . Form Completion    BMI form   Here today with a biometric form for work because she did not meet BMI criteria. Has not been exercising or following any particular diet. Eats a lot of rice in her diet and is vegan due to her cultural cuisine.   Also worried about her irregular periods, hirsutism, and acne. Dx'd with PCOS around the time of her first pregnancy. Not currently on contraceptives, passively trying for pregnancy. Was previously on metformin for this.   Relevant past medical, surgical, family and social history reviewed and updated as indicated. Interim medical history since our last visit reviewed. Allergies and medications reviewed and updated.  Review of Systems  Per HPI unless specifically indicated above     Objective:    BP 121/77   Pulse 74   Temp 98.7 F (37.1 C) (Oral)   Ht 5\' 3"  (1.6 m)   Wt 179 lb 14.4 oz (81.6 kg)   SpO2 99%   BMI 31.87 kg/m   Wt Readings from Last 3 Encounters:  12/26/17 179 lb 14.4 oz (81.6 kg)  04/14/17 178 lb 12.8 oz (81.1 kg)  03/14/17 176 lb 11.2 oz (80.2 kg)    Physical Exam  Constitutional: She is oriented to person, place, and time. She appears well-developed and well-nourished. No distress.  HENT:  Head: Atraumatic.  Eyes: Conjunctivae and EOM are normal.  Neck: Normal range of motion. Neck supple.  Cardiovascular: Normal rate and regular rhythm.  Pulmonary/Chest: Effort normal and breath sounds normal.  Musculoskeletal: Normal range of motion.  Neurological: She is alert and oriented to person, place, and time.  Skin: Skin is warm and dry.  Acne present on face  Psychiatric: She has a normal  mood and affect. Her behavior is normal.  Nursing note and vitals reviewed.   Results for orders placed or performed in visit on 03/14/17  Microscopic Examination  Result Value Ref Range   WBC, UA 6-10 (A) 0 - 5 /hpf   RBC, UA 0-2 0 - 2 /hpf   Epithelial Cells (non renal) 0-10 0 - 10 /hpf   Bacteria, UA Few None seen/Few  Pregnancy, urine  Result Value Ref Range   Preg Test, Ur Negative Negative  CBC with Differential/Platelet  Result Value Ref Range   WBC 9.8 3.4 - 10.8 x10E3/uL   RBC 4.94 3.77 - 5.28 x10E6/uL   Hemoglobin 12.3 11.1 - 15.9 g/dL   Hematocrit 74.2 59.5 - 46.6 %   MCV 78 (L) 79 - 97 fL   MCH 24.9 (L) 26.6 - 33.0 pg   MCHC 31.8 31.5 - 35.7 g/dL   RDW 63.8 75.6 - 43.3 %   Platelets 254 150 - 379 x10E3/uL   Neutrophils 70 Not Estab. %   Lymphs 23 Not Estab. %   Monocytes 6 Not Estab. %   Eos 1 Not Estab. %   Basos 0 Not Estab. %   Neutrophils Absolute 6.8 1.4 - 7.0 x10E3/uL   Lymphocytes Absolute 2.3 0.7 - 3.1 x10E3/uL  Monocytes Absolute 0.6 0.1 - 0.9 x10E3/uL   EOS (ABSOLUTE) 0.1 0.0 - 0.4 x10E3/uL   Basophils Absolute 0.0 0.0 - 0.2 x10E3/uL   Immature Granulocytes 0 Not Estab. %   Immature Grans (Abs) 0.0 0.0 - 0.1 x10E3/uL  Comprehensive metabolic panel  Result Value Ref Range   Glucose 82 65 - 99 mg/dL   BUN 7 6 - 20 mg/dL   Creatinine, Ser 1.61 0.57 - 1.00 mg/dL   GFR calc non Af Amer 111 >59 mL/min/1.73   GFR calc Af Amer 128 >59 mL/min/1.73   BUN/Creatinine Ratio 10 9 - 23   Sodium 138 134 - 144 mmol/L   Potassium 3.6 3.5 - 5.2 mmol/L   Chloride 100 96 - 106 mmol/L   CO2 23 20 - 29 mmol/L   Calcium 9.2 8.7 - 10.2 mg/dL   Total Protein 8.2 6.0 - 8.5 g/dL   Albumin 4.6 3.5 - 5.5 g/dL   Globulin, Total 3.6 1.5 - 4.5 g/dL   Albumin/Globulin Ratio 1.3 1.2 - 2.2   Bilirubin Total 0.3 0.0 - 1.2 mg/dL   Alkaline Phosphatase 90 39 - 117 IU/L   AST 13 0 - 40 IU/L   ALT 10 0 - 32 IU/L  Lipid Panel w/o Chol/HDL Ratio  Result Value Ref Range    Cholesterol, Total 192 100 - 199 mg/dL   Triglycerides 096 0 - 149 mg/dL   HDL 43 >04 mg/dL   VLDL Cholesterol Cal 27 5 - 40 mg/dL   LDL Calculated 540 (H) 0 - 99 mg/dL  TSH  Result Value Ref Range   TSH 2.730 0.450 - 4.500 uIU/mL  UA/M w/rflx Culture, Routine  Result Value Ref Range   Specific Gravity, UA 1.015 1.005 - 1.030   pH, UA 5.5 5.0 - 7.5   Color, UA Yellow Yellow   Appearance Ur Clear Clear   Leukocytes, UA Trace (A) Negative   Protein, UA Negative Negative/Trace   Glucose, UA Negative Negative   Ketones, UA Negative Negative   RBC, UA Negative Negative   Bilirubin, UA Negative Negative   Urobilinogen, Ur 0.2 0.2 - 1.0 mg/dL   Nitrite, UA Negative Negative   Microscopic Examination See below:   IGP, Aptima HPV, rfx 16/18,45  Result Value Ref Range   DIAGNOSIS: Comment    Specimen adequacy: Comment    Clinician Provided ICD10 Comment    Performed by: Comment    PAP Smear Comment .    Note: Comment    Test Methodology Comment    HPV Aptima Negative Negative      Assessment & Plan:   Problem List Items Addressed This Visit      Endocrine   PCOS (polycystic ovarian syndrome)    Will restart metformin to help with insulin resistance and hopefully better regulate sxs. Discussed importance of lifestyle modifications to help further. Does not wish to start contraceptives at this time. Follow up with GYN if not improving.         Other   Obesity - Primary    Long discussion today about lifestyle modifications, particularly substituting white rice for veggie based rice and cutting out sugary beverages. Will work on exercising more as well.       Relevant Medications   metFORMIN (GLUCOPHAGE) 500 MG tablet    Other Visit Diagnoses    Need for influenza vaccination       Relevant Orders   Flu Vaccine QUAD 36+ mos IM (Completed)   Hirsutism  In setting of PCOS. Will hold spironolactone at this time as she's passively trying for pregnancy. Discussed laser  hair removal, waxing       Follow up plan: Return in about 1 year (around 12/27/2018) for CPE.

## 2017-12-29 DIAGNOSIS — E282 Polycystic ovarian syndrome: Secondary | ICD-10-CM | POA: Insufficient documentation

## 2017-12-29 MED ORDER — METFORMIN HCL 500 MG PO TABS: 500.0000 mg | ORAL_TABLET | Freq: Every day | ORAL | 5 refills | Status: DC

## 2017-12-29 NOTE — Assessment & Plan Note (Signed)
Long discussion today about lifestyle modifications, particularly substituting white rice for veggie based rice and cutting out sugary beverages. Will work on exercising more as well.

## 2017-12-29 NOTE — Assessment & Plan Note (Signed)
Will restart metformin to help with insulin resistance and hopefully better regulate sxs. Discussed importance of lifestyle modifications to help further. Does not wish to start contraceptives at this time. Follow up with GYN if not improving.

## 2017-12-29 NOTE — Patient Instructions (Signed)
Follow up for CPE 

## 2018-03-16 ENCOUNTER — Encounter: Payer: Self-pay | Admitting: Nurse Practitioner

## 2018-03-16 ENCOUNTER — Ambulatory Visit (INDEPENDENT_AMBULATORY_CARE_PROVIDER_SITE_OTHER): Payer: Managed Care, Other (non HMO) | Admitting: Nurse Practitioner

## 2018-03-16 VITALS — BP 116/80 | HR 78 | Temp 98.9°F | Ht 62.0 in | Wt 179.0 lb

## 2018-03-16 DIAGNOSIS — B9789 Other viral agents as the cause of diseases classified elsewhere: Secondary | ICD-10-CM | POA: Insufficient documentation

## 2018-03-16 DIAGNOSIS — J028 Acute pharyngitis due to other specified organisms: Secondary | ICD-10-CM | POA: Diagnosis not present

## 2018-03-16 DIAGNOSIS — Z0000 Encounter for general adult medical examination without abnormal findings: Secondary | ICD-10-CM

## 2018-03-16 DIAGNOSIS — Z13220 Encounter for screening for lipoid disorders: Secondary | ICD-10-CM

## 2018-03-16 DIAGNOSIS — Z1329 Encounter for screening for other suspected endocrine disorder: Secondary | ICD-10-CM | POA: Diagnosis not present

## 2018-03-16 NOTE — Assessment & Plan Note (Signed)
Salt water gargles at home.  Zyrtec daily for nasal drainage.  Tylenol/Ibuprofen for discomfort.  Return for worsening or continued symptoms.  No abx at this time, will consider if ongoing or worsening symptoms.

## 2018-03-16 NOTE — Patient Instructions (Signed)
Healthy Eating Following a healthy eating pattern may help you to achieve and maintain a healthy body weight, reduce the risk of chronic disease, and live a long and productive life. It is important to follow a healthy eating pattern at an appropriate calorie level for your body. Your nutritional needs should be met primarily through food by choosing a variety of nutrient-rich foods. What are tips for following this plan? Reading food labels  Read labels and choose the following: ? Reduced or low sodium. ? Juices with 100% fruit juice. ? Foods with low saturated fats and high polyunsaturated and monounsaturated fats. ? Foods with whole grains, such as whole wheat, cracked wheat, brown rice, and wild rice. ? Whole grains that are fortified with folic acid. This is recommended for women who are pregnant or who want to become pregnant.  Read labels and avoid the following: ? Foods with a lot of added sugars. These include foods that contain brown sugar, corn sweetener, corn syrup, dextrose, fructose, glucose, high-fructose corn syrup, honey, invert sugar, lactose, malt syrup, maltose, molasses, raw sugar, sucrose, trehalose, or turbinado sugar.  Do not eat more than the following amounts of added sugar per day:  6 teaspoons (25 g) for women.  9 teaspoons (38 g) for men. ? Foods that contain processed or refined starches and grains. ? Refined grain products, such as white flour, degermed cornmeal, white bread, and white rice. Shopping  Choose nutrient-rich snacks, such as vegetables, whole fruits, and nuts. Avoid high-calorie and high-sugar snacks, such as potato chips, fruit snacks, and candy.  Use oil-based dressings and spreads on foods instead of solid fats such as butter, stick margarine, or cream cheese.  Limit pre-made sauces, mixes, and "instant" products such as flavored rice, instant noodles, and ready-made pasta.  Try more plant-protein sources, such as tofu, tempeh, black beans,  edamame, lentils, nuts, and seeds.  Explore eating plans such as the Mediterranean diet or vegetarian diet. Cooking  Use oil to saut or stir-fry foods instead of solid fats such as butter, stick margarine, or lard.  Try baking, boiling, grilling, or broiling instead of frying.  Remove the fatty part of meats before cooking.  Steam vegetables in water or broth. Meal planning   At meals, imagine dividing your plate into fourths: ? One-half of your plate is fruits and vegetables. ? One-fourth of your plate is whole grains. ? One-fourth of your plate is protein, especially lean meats, poultry, eggs, tofu, beans, or nuts.  Include low-fat dairy as part of your daily diet. Lifestyle  Choose healthy options in all settings, including home, work, school, restaurants, or stores.  Prepare your food safely: ? Wash your hands after handling raw meats. ? Keep food preparation surfaces clean by regularly washing with hot, soapy water. ? Keep raw meats separate from ready-to-eat foods, such as fruits and vegetables. ? Cook seafood, meat, poultry, and eggs to the recommended internal temperature. ? Store foods at safe temperatures. In general:  Keep cold foods at 59F (4.4C) or below.  Keep hot foods at 159F (60C) or above.  Keep your freezer at [facility] (-17.8C) or below.  Foods are no longer safe to eat when they have been between the temperatures of 40-159F (4.4-60C) for more than 2 hours. What foods should I eat? Fruits Aim to eat 2 cup-equivalents of fresh, canned (in natural juice), or frozen fruits each day. Examples of 1 cup-equivalent of fruit include 1 small apple, 8 large strawberries, 1 cup canned fruit,  cup  dried fruit, or 1 cup 100% juice. Vegetables Aim to eat 2-3 cup-equivalents of fresh and frozen vegetables each day, including different varieties and colors. Examples of 1 cup-equivalent of vegetables include 2 medium carrots, 2 cups raw, leafy greens, 1 cup chopped  vegetable (raw or cooked), or 1 medium baked potato. Grains Aim to eat 6 ounce-equivalents of whole grains each day. Examples of 1 ounce-equivalent of grains include 1 slice of bread, 1 cup ready-to-eat cereal, 3 cups popcorn, or  cup cooked rice, pasta, or cereal. Meats and other proteins Aim to eat 5-6 ounce-equivalents of protein each day. Examples of 1 ounce-equivalent of protein include 1 egg, 1/2 cup nuts or seeds, or 1 tablespoon (16 g) peanut butter. A cut of meat or fish that is the size of a deck of cards is about 3-4 ounce-equivalents.  Of the protein you eat each week, try to have at least 8 ounces come from seafood. This includes salmon, trout, herring, and anchovies. Dairy Aim to eat 3 cup-equivalents of fat-free or low-fat dairy each day. Examples of 1 cup-equivalent of dairy include 1 cup (240 mL) milk, 8 ounces (250 g) yogurt, 1 ounces (44 g) natural cheese, or 1 cup (240 mL) fortified soy milk. Fats and oils  Aim for about 5 teaspoons (21 g) per day. Choose monounsaturated fats, such as canola and olive oils, avocados, peanut butter, and most nuts, or polyunsaturated fats, such as sunflower, corn, and soybean oils, walnuts, pine nuts, sesame seeds, sunflower seeds, and flaxseed. Beverages  Aim for six 8-oz glasses of water per day. Limit coffee to three to five 8-oz cups per day.  Limit caffeinated beverages that have added calories, such as soda and energy drinks.  Limit alcohol intake to no more than 1 drink a day for nonpregnant women and 2 drinks a day for men. One drink equals 12 oz of beer (355 mL), 5 oz of wine (148 mL), or 1 oz of hard liquor (44 mL). Seasoning and other foods  Avoid adding excess amounts of salt to your foods. Try flavoring foods with herbs and spices instead of salt.  Avoid adding sugar to foods.  Try using oil-based dressings, sauces, and spreads instead of solid fats. This information is based on general [place] nutrition guidelines. For more  information, visit [employer]. Exact amounts may vary based on your nutrition needs. Summary  A healthy eating plan may help you to maintain a healthy weight, reduce the risk of chronic diseases, and stay active throughout your life.  Plan your meals. Make sure you eat the right portions of a variety of nutrient-rich foods.  Try baking, boiling, grilling, or broiling instead of frying.  Choose healthy options in all settings, including home, work, school, restaurants, or stores. This information is not intended to replace advice given to you by your health care provider. Make sure you discuss any questions you have with your health care provider. Document Released: 06/16/2017 Document Revised: 06/16/2017 Document Reviewed: 06/16/2017 [employer]vier Interactive Patient Education  2019 [employer].

## 2018-03-16 NOTE — Progress Notes (Signed)
BP 116/80   Pulse 78   Temp 98.9 F (37.2 C) (Oral)   Ht 5\' 2"  (1.575 m)   Wt 179 lb (81.2 kg)   SpO2 98%   BMI 32.74 kg/m    Subjective:    Patient ID: Tina AlconSaranya Tenny, female    DOB: Mar 18, 1986, 32 y.o.   MRN: [id number]  HPI: Tina Francis is a 32 y.o. female presenting on 03/16/2018 for comprehensive medical examination. Current medical complaints include:Sore Throat  SORE THROAT: Started on Saturday.  She reports having similar issue last year at this time and after a week or two it went away.  Denies rhinorrhea, fever, cough, congestion, SOB, eye pain, headache, or ear pain.  Pain is 5-6/10 when swallowing.  Has been using Halls at home to soothe throat.  States her son was sick a week ago and [place]    She currently lives with: husband and child Menopausal Symptoms: no  Depression Screen done today and results listed below:  [facility] 03/16/2018 03/14/2017 03/14/2017  Decreased Interest 0 0 0  Down, Depressed, Hopeless 0 0 0  PHQ - 2 Score 0 0 0  Altered sleeping 1 1 -  Tired, decreased energy 0 0 -  Change in appetite 0 0 -  Feeling bad or failure about yourself  0 0 -  Trouble concentrating 0 0 -  Moving slowly or fidgety/restless 0 0 -  Suicidal thoughts 0 0 -  PHQ-9 Score 1 1 -  Difficult doing work/chores Not difficult at all Not difficult at all -    The patient does not have a history of falls. I did not complete a risk assessment for falls. A plan of care for falls was not documented.   Past Medical History:  Past Medical History:  Diagnosis Date  . Gestational diabetes   . Infertility   . Vacuum extractor delivery, delivered 04/29/2013    Surgical History:  Past Surgical History:  Procedure Laterality Date  . NO PAST SURGERIES      Medications:  Current Outpatient Medications on File Prior to Visit  Medication Sig  . metFORMIN (GLUCOPHAGE) 500 MG tablet Take 1 tablet (500 mg  total) by mouth daily with breakfast.  . cetirizine (ZYRTEC) 10 MG tablet Take 1 tablet (10 mg total) by mouth daily. (Patient not taking: Reported on 12/26/2017)  . fluticasone (FLONASE) 50 MCG/ACT nasal spray Place 2 sprays into both nostrils 2 (two) times daily. (Patient not taking: Reported on 12/26/2017)   No current facility-administered medications on file prior to visit.     Allergies:  No Known Allergies  Social History:  Social History   Socioeconomic History  . Marital status: Married    Spouse name: Not on file  . Number of children: Not on file  . Years of education: Not on file  . Highest education level: Not on file  Occupational History  . Not on file  Social Needs  . Financial resource strain: Not on file  . Food insecurity:    Worry: Not on file    Inability: Not on file  . Transportation needs:    Medical: Not on file    Non-medical: Not on file  Tobacco Use  . Smoking status: Never Smoker  . Smokeless tobacco: Never Used  Substance and Sexual Activity  . Alcohol use: No  . Drug use: No  . Sexual activity: Not on file  Lifestyle  . Physical activity:  Days per week: Not on file    Minutes per session: Not on file  . Stress: Not on file  Relationships  . Social connections:    Talks on phone: Not on file    Gets together: Not on file    Attends religious service: Not on file    Active member of club or organization: Not on file    Attends meetings of clubs or organizations: Not on file    Relationship status: Not on file  . Intimate partner violence:    Fear of current or ex partner: Not on file    Emotionally abused: Not on file    Physically abused: Not on file    Forced sexual activity: Not on file  Other Topics Concern  . Not on file  Social History Narrative  . Not on file   Social History   Tobacco Use  Smoking Status Never Smoker  Smokeless Tobacco Never Used   Social History   Substance and Sexual Activity  Alcohol Use No     Family History:  Family History  Problem Relation Age of Onset  . Heart attack Mother   . Hypertension Mother   . Diabetes Maternal Grandmother     Past medical history, surgical history, medications, allergies, family history and social history reviewed with patient today and changes made to appropriate areas of the chart.   Review of Systems - Negative except for sore throat All other ROS negative except what is listed above and in the HPI.      Objective:    BP 116/80   Pulse 78   Temp 98.9 F (37.2 C) (Oral)   Ht 5\' 2"  (1.575 m)   Wt 179 lb (81.2 kg)   SpO2 98%   BMI 32.74 kg/m   Wt Readings from Last 3 Encounters:  03/16/18 179 lb (81.2 kg)  12/26/17 179 lb 14.4 oz (81.6 kg)  04/14/17 178 lb 12.8 oz (81.1 kg)    Physical Exam Vitals signs and nursing note reviewed.  Constitutional:      General: She is awake.     Appearance: She is well-developed and overweight. She is not ill-appearing.  HENT:     Head: Normocephalic and atraumatic.     Right Ear: Hearing, tympanic membrane, ear canal and external ear normal.     Left Ear: Hearing, tympanic membrane, ear canal and external ear normal.     Nose: Nose normal. No mucosal edema or rhinorrhea.     Right Sinus: No maxillary sinus tenderness or frontal sinus tenderness.     Left Sinus: No maxillary sinus tenderness or frontal sinus tenderness.     Mouth/Throat:     Lips: Pink.     Mouth: Mucous membranes are moist.     Pharynx: Posterior oropharyngeal erythema (very mild erythema with cobblestoning) present. No pharyngeal swelling or oropharyngeal exudate.  Eyes:     General:        Right eye: No discharge.        Left eye: No discharge.     Conjunctiva/sclera: Conjunctivae normal.     Pupils: Pupils are equal, round, and reactive to light.  Neck:     Musculoskeletal: Normal range of motion and neck supple.     Thyroid: No thyromegaly.     Vascular: No carotid bruit or JVD.  Cardiovascular:     Rate and  Rhythm: Normal rate and regular rhythm.     Pulses: Normal pulses.     Heart  sounds: Normal heart sounds. No murmur. No gallop.   Pulmonary:     Effort: Pulmonary effort is normal.     Breath sounds: Normal breath sounds.  Chest:     Breasts:        Right: No swelling, bleeding, inverted nipple, mass, nipple discharge or tenderness.        Left: No swelling, bleeding, inverted nipple, mass, nipple discharge or tenderness.  Abdominal:     General: Bowel sounds are normal.     Palpations: Abdomen is soft. There is no hepatomegaly or splenomegaly.  Musculoskeletal: Normal range of motion.  Lymphadenopathy:     Cervical: No cervical adenopathy.     Upper Body:     Right upper body: No supraclavicular or axillary adenopathy.     Left upper body: No supraclavicular or axillary adenopathy.  Skin:    General: Skin is warm and dry.  Neurological:     Mental Status: She is alert and oriented to person, place, and time.     Deep Tendon Reflexes: Reflexes are normal and symmetric.     Reflex Scores:      Brachioradialis reflexes are 2+ on the right side and 2+ on the left side.      Patellar reflexes are 2+ on the right side and 2+ on the left side. Psychiatric:        Attention and Perception: Attention normal.        Mood and Affect: Mood normal.        Behavior: Behavior normal. Behavior is cooperative.        Thought Content: Thought content normal.        Judgment: Judgment normal.     Results for orders placed or performed in visit on 03/14/17  Microscopic Examination  Result Value Ref Range   WBC, UA 6-10 (A) 0 - 5 /hpf   RBC, UA 0-2 0 - 2 /hpf   Epithelial Cells (non renal) 0-10 0 - 10 /hpf   Bacteria, UA Few None seen/Few  Pregnancy, urine  Result Value Ref Range   Preg Test, Ur Negative Negative  CBC with Differential/Platelet  Result Value Ref Range   WBC 9.8 3.4 - 10.8 x10E3/uL   RBC 4.94 3.77 - 5.28 x10E6/uL   Hemoglobin 12.3 11.1 - 15.9 g/dL   Hematocrit 16.138.7 09.634.0  - 46.6 %   MCV 78 (L) 79 - 97 fL   MCH 24.9 (L) 26.6 - 33.0 pg   MCHC 31.8 31.5 - 35.7 g/dL   RDW 04.515.1 40.912.3 - 81.115.4 %   Platelets 254 150 - 379 x10E3/uL   Neutrophils 70 Not Estab. %   Lymphs 23 Not Estab. %   Monocytes 6 Not Estab. %   Eos 1 Not Estab. %   Basos 0 Not Estab. %   Neutrophils Absolute 6.8 1.4 - 7.0 x10E3/uL   Lymphocytes Absolute 2.3 0.7 - 3.1 x10E3/uL   Monocytes Absolute 0.6 0.1 - 0.9 x10E3/uL   EOS (ABSOLUTE) 0.1 0.0 - 0.4 x10E3/uL   Basophils Absolute 0.0 0.0 - 0.2 x10E3/uL   Immature Granulocytes 0 Not Estab. %   Immature Grans (Abs) 0.0 0.0 - 0.1 x10E3/uL  Comprehensive metabolic panel  Result Value Ref Range   Glucose 82 65 - 99 mg/dL   BUN 7 6 - 20 mg/dL   Creatinine, Ser 9.140.73 0.57 - 1.00 mg/dL   GFR calc non Af Amer 111 >59 mL/min/1.73   GFR calc Af Amer 128 >59 mL/min/1.73   BUN/Creatinine  Ratio 10 9 - 23   Sodium 138 134 - 144 mmol/L   Potassium 3.6 3.5 - 5.2 mmol/L   Chloride 100 96 - 106 mmol/L   CO2 23 20 - 29 mmol/L   Calcium 9.2 8.7 - 10.2 mg/dL   Total Protein 8.2 6.0 - 8.5 g/dL   Albumin 4.6 3.5 - 5.5 g/dL   Globulin, Total 3.6 1.5 - 4.5 g/dL   Albumin/Globulin Ratio 1.3 1.2 - 2.2   Bilirubin Total 0.3 0.0 - 1.2 mg/dL   Alkaline Phosphatase 90 39 - 117 IU/L   AST 13 0 - 40 IU/L   ALT 10 0 - 32 IU/L  Lipid Panel w/o Chol/HDL Ratio  Result Value Ref Range   Cholesterol, Total 192 100 - 199 mg/dL   Triglycerides 161 0 - 149 mg/dL   HDL 43 >09 mg/dL   VLDL Cholesterol Cal 27 5 - 40 mg/dL   LDL Calculated 604 (H) 0 - 99 mg/dL  TSH  Result Value Ref Range   TSH 2.730 0.450 - 4.500 uIU/mL  UA/M w/rflx Culture, Routine  Result Value Ref Range   Specific Gravity, UA 1.015 1.005 - 1.030   pH, UA 5.5 5.0 - 7.5   Color, UA Yellow Yellow   Appearance Ur Clear Clear   Leukocytes, UA Trace (A) Negative   Protein, UA Negative Negative/Trace   Glucose, UA Negative Negative   Ketones, UA Negative Negative   RBC, UA Negative Negative    Bilirubin, UA Negative Negative   Urobilinogen, Ur 0.2 0.2 - 1.0 mg/dL   Nitrite, UA Negative Negative   Microscopic Examination See below:   IGP, Aptima HPV, rfx 16/18,45  Result Value Ref Range   DIAGNOSIS: Comment    Specimen adequacy: Comment    Clinician Provided ICD10 Comment    Performed by: Comment    PAP Smear Comment .    Note: Comment    Test Methodology Comment    HPV Aptima Negative Negative      Assessment & Plan:   Problem List Items Addressed This Visit      Respiratory   Sore throat (viral)    Salt water gargles at home.  Zyrtec daily for nasal drainage.  Tylenol/Ibuprofen for discomfort.  Return for worsening or continued symptoms.  No abx at this time, will consider if ongoing or worsening symptoms.       Other Visit Diagnoses    Healthy adult on routine physical examination    -  Primary   Routine labs performed.   Relevant Orders   CBC with Differential/Platelet   Comprehensive metabolic panel   TSH   Lipid Panel w/o Chol/HDL Ratio   Thyroid disorder screen       Relevant Orders   TSH   Screening cholesterol level       Relevant Orders   Lipid Panel w/o Chol/HDL Ratio       Follow up plan: Return in about 1 year (around 03/17/2019) for annual physical.   LABORATORY TESTING:  - Pap smear: up to date  IMMUNIZATIONS:   - Tdap: Tetanus vaccination status reviewed: last tetanus booster within 10 years. - Influenza: Up to date - Pneumovax: Not applicable - Prevnar: Not applicable - HPV: Not applicable - Zostavax vaccine: Not applicable  SCREENING: -Mammogram: Not applicable  - Colonoscopy: Not applicable  - Bone Density: Not applicable  -Hearing Test: Not applicable  -Spirometry: Not applicable   PATIENT COUNSELING:   Advised to take 1 mg of folate  supplement per day if capable of pregnancy.   Sexuality: Discussed sexually transmitted diseases, partner selection, use of condoms, avoidance of unintended pregnancy  and contraceptive  alternatives.   Advised to avoid cigarette smoking.  I discussed with the patient that most people either abstain from alcohol or drink within safe limits (<=14/week and <=4 drinks/occasion for males, <=7/weeks and <= 3 drinks/occasion for females) and that the risk for alcohol disorders and other health effects rises proportionally with the number of drinks per week and how often a drinker exceeds daily limits.  Discussed cessation/primary prevention of drug use and availability of treatment for abuse.   Diet: Encouraged to adjust caloric intake to maintain  or achieve ideal body weight, to reduce intake of dietary saturated fat and total fat, to limit sodium intake by avoiding high sodium foods and not adding table salt, and to maintain adequate dietary potassium and calcium preferably from fresh fruits, vegetables, and low-fat dairy products.    stressed the importance of regular exercise  Injury prevention: Discussed safety belts, safety helmets, smoke detector, smoking near bedding or upholstery.   Dental health: Discussed importance of regular tooth brushing, flossing, and dental visits.    NEXT PREVENTATIVE PHYSICAL DUE IN 1 YEAR. Return in about 1 year (around 03/17/2019) for annual physical.

## 2018-03-17 LAB — COMPREHENSIVE METABOLIC PANEL
ALBUMIN: 4.2 g/dL (ref 3.5–5.5)
ALT: 11 IU/L (ref 0–32)
AST: 17 IU/L (ref 0–40)
Albumin/Globulin Ratio: 1.3 (ref 1.2–2.2)
Alkaline Phosphatase: 83 IU/L (ref 39–117)
BUN / CREAT RATIO: 9 (ref 9–23)
BUN: 7 mg/dL (ref 6–20)
Bilirubin Total: 0.2 mg/dL (ref 0.0–1.2)
CO2: 22 mmol/L (ref 20–29)
CREATININE: 0.77 mg/dL (ref 0.57–1.00)
Calcium: 9.1 mg/dL (ref 8.7–10.2)
Chloride: 103 mmol/L (ref 96–106)
GFR, EST AFRICAN AMERICAN: 119 mL/min/{1.73_m2} (ref >59)
GFR, EST NON AFRICAN AMERICAN: 103 mL/min/{1.73_m2} (ref >59)
Globulin, Total: 3.2 g/dL (ref 1.5–4.5)
Glucose: 90 mg/dL (ref 65–99)
Potassium: 4.2 mmol/L (ref 3.5–5.2)
Sodium: 138 mmol/L (ref 134–144)
Total Protein: 7.4 g/dL (ref 6.0–8.5)

## 2018-03-17 LAB — CBC WITH DIFFERENTIAL/PLATELET
BASOS: 0 %
Basophils Absolute: 0 10*3/uL (ref 0.0–0.2)
EOS (ABSOLUTE): 0.1 10*3/uL (ref 0.0–0.4)
Eos: 1 %
HEMATOCRIT: 36.8 % (ref 34.0–46.6)
Hemoglobin: 11.9 g/dL (ref 11.1–15.9)
Immature Grans (Abs): 0 10*3/uL (ref 0.0–0.1)
Immature Granulocytes: 0 %
Lymphocytes Absolute: 1.9 10*3/uL (ref 0.7–3.1)
Lymphs: 23 %
MCH: 25.1 pg — ABNORMAL LOW (ref 26.6–33.0)
MCHC: 32.3 g/dL (ref 31.5–35.7)
MCV: 78 fL — ABNORMAL LOW (ref 79–97)
MONOS ABS: 0.6 10*3/uL (ref 0.1–0.9)
Monocytes: 7 %
Neutrophils Absolute: 5.5 10*3/uL (ref 1.4–7.0)
Neutrophils: 69 %
Platelets: 221 10*3/uL (ref 150–450)
RBC: 4.75 x10E6/uL (ref 3.77–5.28)
RDW: 14.4 % (ref 12.3–15.4)
WBC: 8.1 10*3/uL (ref 3.4–10.8)

## 2018-03-17 LAB — LIPID PANEL W/O CHOL/HDL RATIO
Cholesterol, Total: 165 mg/dL (ref 100–199)
HDL: 42 mg/dL (ref >39)
LDL Calculated: 90 mg/dL (ref 0–99)
TRIGLYCERIDES: 166 mg/dL — AB (ref 0–149)
VLDL CHOLESTEROL CAL: 33 mg/dL (ref 5–40)

## 2018-03-17 LAB — TSH: TSH: 3.46 u[IU]/mL (ref 0.450–4.500)

## 2018-05-25 ENCOUNTER — Encounter: Payer: Self-pay | Admitting: Family Medicine

## 2018-05-25 ENCOUNTER — Ambulatory Visit: Payer: Managed Care, Other (non HMO) | Admitting: Family Medicine

## 2018-05-25 VITALS — BP 119/80 | HR 80 | Temp 98.4°F | Ht 63.0 in | Wt 180.7 lb

## 2018-05-25 DIAGNOSIS — R110 Nausea: Secondary | ICD-10-CM | POA: Diagnosis not present

## 2018-05-25 DIAGNOSIS — S161XXA Strain of muscle, fascia and tendon at neck level, initial encounter: Secondary | ICD-10-CM

## 2018-05-25 DIAGNOSIS — G4700 Insomnia, unspecified: Secondary | ICD-10-CM | POA: Diagnosis not present

## 2018-05-25 LAB — PREGNANCY, URINE: Preg Test, Ur: NEGATIVE

## 2018-05-25 MED ORDER — KETOROLAC TROMETHAMINE 60 MG/2ML IM SOLN
60.0000 mg | Freq: Once | INTRAMUSCULAR | Status: AC
Start: 2018-05-25 — End: 2018-05-25
  Administered 2018-05-25: 60 mg via INTRAMUSCULAR

## 2018-05-25 MED ORDER — CYCLOBENZAPRINE HCL 5 MG PO TABS: 5.0000 mg | ORAL_TABLET | Freq: Three times a day (TID) | ORAL | 0 refills | Status: DC | PRN

## 2018-05-25 NOTE — Progress Notes (Signed)
BP 119/80 (BP Location: Left Arm, Patient Position: Sitting, Cuff Size: Normal)   Pulse 80   Temp 98.4 F (36.9 C) (Oral)   Ht 5\' 3"  (1.6 m)   Wt 180 lb 11.2 oz (82 kg)   SpO2 100%   BMI 32.01 kg/m    Subjective:    Patient ID: Tina Francis, female    DOB: 1985-04-25, 33 y.o.   MRN: [id number]  HPI: Tina Francis is a 33 y.o. female  Chief Complaint  Patient presents with  . Neck Pain    2 weeks ago, playing with son and started having neck pain. Now has radiated to head and patient has had migraines.    1-2 weeks of right sided neck pain that started suddenly after turning wrong while playing with her son. Since has been having pressure across scalp and into temples. Using heat, OTC pain relievers without much relief. No dizziness, visual or hearing issues, radiation down arm, numbness or tingling to fingers.   Sleeping poorly for years, sometimes does not fall asleep until 2 am. Was on something OTC in 2017. Has not tried anything OTC.   Nausea without vomiting worst in the mornings the past few days to a week. Has been very stressed at work but otherwise no major changes in life recently. No recent travel, new foods, new medicines aside from a fair amount of ibuprofen for her neck pain. LMP 1 week ago.   Relevant past medical, surgical, family and social history reviewed and updated as indicated. Interim medical history since our last visit reviewed. Allergies and medications reviewed and updated.  Review of Systems  Per HPI unless specifically indicated above     Objective:    BP 119/80 (BP Location: Left Arm, Patient Position: Sitting, Cuff Size: Normal)   Pulse 80   Temp 98.4 F (36.9 C) (Oral)   Ht 5\' 3"  (1.6 m)   Wt 180 lb 11.2 oz (82 kg)   SpO2 100%   BMI 32.01 kg/m   Wt Readings from Last 3 Encounters:  05/25/18 180 lb 11.2 oz (82 kg)  03/16/18 179 lb (81.2 kg)  12/26/17 179 lb 14.4 oz (81.6 kg)    Physical Exam Vitals signs and nursing note  reviewed.  Constitutional:      Appearance: Normal appearance. She is not ill-appearing.  HENT:     Head: Atraumatic.  Eyes:     Extraocular Movements: Extraocular movements intact.     Conjunctiva/sclera: Conjunctivae normal.  Neck:     Musculoskeletal: Normal range of motion and neck supple.  Cardiovascular:     Rate and Rhythm: Normal rate and regular rhythm.     Heart sounds: Normal heart sounds.  Pulmonary:     Effort: Pulmonary effort is normal.     Breath sounds: Normal breath sounds.  Abdominal:     General: Bowel sounds are normal.     Palpations: Abdomen is soft.     Tenderness: There is no abdominal tenderness. There is no right CVA tenderness, left CVA tenderness or guarding.  Musculoskeletal: Normal range of motion.        General: Tenderness (right trapezius muscles ttp and in spasm) present.  Skin:    General: Skin is warm and dry.  Neurological:     Mental Status: She is alert and oriented to person, place, and time.  Psychiatric:        Mood and Affect: Mood normal.        Thought Content: Thought content  normal.        Judgment: Judgment normal.     Results for orders placed or performed in visit on 05/25/18  Pregnancy, urine  Result Value Ref Range   Preg Test, Ur Negative Negative      Assessment & Plan:   Problem List Items Addressed This Visit    None    Visit Diagnoses    Nausea    -  Primary   Suspect related to the NSAIDs she's taking for neck pain. Start prilosec for the next few weeks and bland diet. F/u if no better. Urine preg neg   Relevant Orders   Pregnancy, urine (Completed)   Strain of neck muscle, initial encounter       IM toradol given, flexeril and tylenol prn, massage, heat. F/u if not improving   Relevant Medications   ketorolac (TORADOL) injection 60 mg (Completed)   Insomnia, unspecified type       Trial unisom, melatonin, essential oils, increased exercise. F/u if not improving       Follow up plan: Return if  symptoms worsen or fail to improve.

## 2018-05-28 ENCOUNTER — Emergency Department
Admission: EM | Admit: 2018-05-28 | Discharge: 2018-05-28 | Disposition: A | Discharge status: Home / Self Care | Payer: Managed Care, Other (non HMO) | Attending: Emergency Medicine | Admitting: Emergency Medicine

## 2018-05-28 ENCOUNTER — Other Ambulatory Visit: Payer: Self-pay

## 2018-05-28 ENCOUNTER — Encounter: Payer: Self-pay | Admitting: *Deleted

## 2018-05-28 ENCOUNTER — Emergency Department: Payer: Managed Care, Other (non HMO)

## 2018-05-28 DIAGNOSIS — R42 Dizziness and giddiness: Secondary | ICD-10-CM | POA: Diagnosis not present

## 2018-05-28 DIAGNOSIS — M542 Cervicalgia: Secondary | ICD-10-CM | POA: Diagnosis not present

## 2018-05-28 LAB — CBC WITH DIFFERENTIAL/PLATELET
Abs Immature Granulocytes: 0.03 10*3/uL (ref 0.00–0.07)
Basophils Absolute: 0 10*3/uL (ref 0.0–0.1)
Basophils Relative: 0 %
Eosinophils Absolute: 0.1 10*3/uL (ref 0.0–0.5)
Eosinophils Relative: 1 %
HCT: 40.3 % (ref 36.0–46.0)
Hemoglobin: 12.6 g/dL (ref 12.0–15.0)
Immature Granulocytes: 0 %
Lymphocytes Relative: 25 %
Lymphs Abs: 1.9 10*3/uL (ref 0.7–4.0)
MCH: 24.9 pg — ABNORMAL LOW (ref 26.0–34.0)
MCHC: 31.3 g/dL (ref 30.0–36.0)
MCV: 79.6 fL — ABNORMAL LOW (ref 80.0–100.0)
MONOS PCT: 7 %
Monocytes Absolute: 0.5 10*3/uL (ref 0.1–1.0)
Neutro Abs: 5 10*3/uL (ref 1.7–7.7)
Neutrophils Relative %: 67 %
Platelets: 259 10*3/uL (ref 150–400)
RBC: 5.06 MIL/uL (ref 3.87–5.11)
RDW: 13.6 % (ref 11.5–15.5)
WBC: 7.5 10*3/uL (ref 4.0–10.5)
nRBC: 0 % (ref 0.0–0.2)

## 2018-05-28 LAB — BASIC METABOLIC PANEL
Anion gap: 8 (ref 5–15)
BUN: 8 mg/dL (ref 6–20)
CO2: 27 mmol/L (ref 22–32)
Calcium: 9.4 mg/dL (ref 8.9–10.3)
Chloride: 103 mmol/L (ref 98–111)
Creatinine, Ser: 0.5 mg/dL (ref 0.44–1.00)
GFR calc Af Amer: >60 mL/min (ref >60)
GFR calc non Af Amer: >60 mL/min (ref >60)
GLUCOSE: 90 mg/dL (ref 70–99)
Potassium: 4.1 mmol/L (ref 3.5–5.1)
Sodium: 138 mmol/L (ref 135–145)

## 2018-05-28 LAB — POCT PREGNANCY, URINE: Preg Test, Ur: NEGATIVE

## 2018-05-28 MED ORDER — SODIUM CHLORIDE 0.9 % IV BOLUS
1000.0000 mL | Freq: Once | INTRAVENOUS | Status: AC
  Administered 2018-05-28: 1000 mL via INTRAVENOUS

## 2018-05-28 MED ORDER — PROCHLORPERAZINE EDISYLATE 10 MG/2ML IJ SOLN
10.0000 mg | Freq: Once | INTRAMUSCULAR | Status: AC
  Administered 2018-05-28: 10 mg via INTRAVENOUS
  Filled 2018-05-28: qty 2

## 2018-05-28 MED ORDER — PROCHLORPERAZINE MALEATE 10 MG PO TABS: 10.0000 mg | ORAL_TABLET | Freq: Three times a day (TID) | ORAL | 0 refills | Status: DC | PRN

## 2018-05-28 MED ORDER — IOHEXOL 350 MG/ML SOLN
75.0000 mL | Freq: Once | INTRAVENOUS | Status: AC | PRN
  Administered 2018-05-28: 75 mL via INTRAVENOUS

## 2018-05-28 NOTE — ED Notes (Signed)
ED Provider at bedside. 

## 2018-05-28 NOTE — ED Notes (Signed)
Neg preg.

## 2018-05-28 NOTE — Discharge Instructions (Addendum)
As we discussed you would benefit from wearing a soft cervical collar which you can pick up at any pharmacy. Please seek medical attention for any high fevers, chest pain, shortness of breath, change in behavior, persistent vomiting, bloody stool or any other new or concerning symptoms.

## 2018-05-28 NOTE — ED Triage Notes (Signed)
First RN Note: Pt presnets to ED with [facility] with c/o neck stiffness x 1.5 weeks, per [facility] pt was seen at PCP and prescribed Flexeril without relief, pt also c/o dizziness and blurred vision since Monday, KC reports 1 episode of emesis today.

## 2018-05-28 NOTE — ED Notes (Signed)
Patient transported to CT 

## 2018-05-28 NOTE — ED Notes (Signed)
Pt verbalizes d/c understanding, follow up , and RX given. PT in NAD, VSS, ambulatory. PT unable to sign due to no computer available for hallway pt

## 2018-05-28 NOTE — ED Triage Notes (Signed)
Pt has 10 day hx of heck pain and stiffness (she describes a torticollis which then expanded to also included headache in occipital area).  Pt was seen at PCP and given a toradol shot as well as a prescription for flexeril.  Pt states that this helped reduce symptoms but she felt drowsy and dizzy when taking the medication.  Pt states that she has been dizzy since Monday and this am she did not take the flexeril but she still has the dizziness.  Pt describes vertigo with where the dizziness is caused by movement and gets worse with jerky movement.  No focal weakness or neuro deficit.  Pt is alert and oriented.

## 2018-05-28 NOTE — ED Provider Notes (Signed)
[facility] Emergency Department Provider Note ____________________________________________   I have reviewed the triage vital signs and the nursing notes.   HISTORY  Chief Complaint Dizziness   History limited by: Not Limited   HPI Tina Francis is a 33 y.o. female who presents to the emergency department today because of concerns for continued right-sided neck pain as well as dizziness.  Patient states that the neck pain started 10 days ago.  Started while she was playing with her child.  She turned her head suddenly immediate onset of pain.  Since that time the pain is been fairly constant.  It was somewhat relieved when she got a shot of Toradol through her primary care doctor's office.  She was also prescribed Flexeril.  The pain is continued even on the Flexeril although she is now developed some dizziness.  The dizziness started after she started Flexeril.  Pain is now no longer in her right side of her neck but also in the right side of her head.  She describes her head pain as a pressure type pain.  She denies any weakness or numbness or in her arms or legs.  Per medical record review patient has a history of recent visit to PCPs with prescription for flexeril.   Past Medical History:  Diagnosis Date  . Gestational diabetes   . Infertility   . Vacuum extractor delivery, delivered 04/29/2013    Patient Active Problem List   Diagnosis Date Noted  . Sore throat (viral) 03/16/2018  . PCOS (polycystic ovarian syndrome) 12/29/2017  . Obesity 12/26/2017  . Vacuum extractor delivery, delivered (2/12) 04/29/2013  . Allergic rhinitis 04/28/2010    Past Surgical History:  Procedure Laterality Date  . NO PAST SURGERIES      Prior to Admission medications   Medication Sig Start Date End Date Taking? Authorizing Provider  cyclobenzaprine (FLEXERIL) 5 MG tablet Take 1 tablet (5 mg total) by mouth 3 (three) times daily as needed for muscle spasms. 05/25/18    Particia Nearing, PA-C  metFORMIN (GLUCOPHAGE) 500 MG tablet Take 1 tablet (500 mg total) by mouth daily with breakfast. Patient not taking: Reported on 05/25/2018 12/29/17   Particia Nearing, PA-C    Allergies Patient has no known allergies.  Family History  Problem Relation Age of Onset  . Heart attack Mother   . Hypertension Mother   . Diabetes Maternal Grandmother     Social History Social History   Tobacco Use  . Smoking status: Never Smoker  . Smokeless tobacco: Never Used  Substance Use Topics  . Alcohol use: No  . Drug use: No    Review of Systems Constitutional: No fever/chills Eyes: No visual changes. ENT: No sore throat. Cardiovascular: Denies chest pain. Respiratory: Denies shortness of breath. Gastrointestinal: No abdominal pain.  No nausea, no vomiting.  No diarrhea.   Genitourinary: Negative for dysuria. Musculoskeletal: Positive for right neck pain.  Skin: Negative for rash. Neurological: Positive for headache. Positive for dizziness.  ____________________________________________   PHYSICAL EXAM:  VITAL SIGNS: ED Triage Vitals [05/28/18 1058]  Enc Vitals Group     BP 130/81     Pulse Rate 83     Resp 18     Temp 98.7 F (37.1 C)     Temp Source Oral     SpO2 100 %     Weight 180 lb (81.6 kg)     Height 5\' 3"  (1.6 m)   Constitutional: Alert and oriented.  Eyes: Conjunctivae  are normal.  ENT      Head: Normocephalic and atraumatic.      Nose: No congestion/rhinnorhea.      Mouth/Throat: Mucous membranes are moist.      Neck: No stridor. Hematological/Lymphatic/Immunilogical: No cervical lymphadenopathy. Cardiovascular: Normal rate, regular rhythm.  No murmurs, rubs, or gallops. Respiratory: Normal respiratory effort without tachypnea nor retractions. Breath sounds are clear and equal bilaterally. No wheezes/rales/rhonchi. Gastrointestinal: Soft and non tender. No rebound. No guarding.  Genitourinary: Deferred Musculoskeletal:  Normal range of motion in all extremities. No lower extremity edema. Neurologic:  Normal speech and language. No gross focal neurologic deficits are appreciated.  Skin:  Skin is warm, dry and intact. No rash noted. Psychiatric: Mood and affect are normal. Speech and behavior are normal. Patient exhibits appropriate insight and judgment.  ____________________________________________    LABS (pertinent positives/negatives)  BMP wnl CBC wbc 7.5, hgb 12.6, plt 259 Upreg neg  ____________________________________________   EKG  None  ____________________________________________    RADIOLOGY  CT head No acute findings  CT angio neck No dissection  ____________________________________________   PROCEDURES  Procedures  ____________________________________________   INITIAL IMPRESSION / ASSESSMENT AND PLAN / ED COURSE  Pertinent labs & imaging results that were available during my care of the patient were reviewed by me and considered in my medical decision making (see chart for details).   Patient presented to the emergency department today because of concerns for continued right neck pain as well as some lightheadedness and dizziness.  Patient states that the neck pain started after turning her neck suddenly. Given continued pain and concern for dizziness and lightheadedness I did have concern for possible arterial dissection. Discussed this with the patient. CT scan without dissection and head ct without bleed or mass. Patient did feel better after IV fluids and medication. Will plan on discharging with prescription for further medication.    ____________________________________________   FINAL CLINICAL IMPRESSION(S) / ED DIAGNOSES  Final diagnoses:  Lightheadedness  Neck pain     Note: This dictation was prepared with Dragon dictation. Any transcriptional errors that result from this process are unintentional     Phineas Semen, MD 05/28/18 1445

## 2018-06-18 ENCOUNTER — Ambulatory Visit: Payer: Self-pay

## 2018-06-18 NOTE — Telephone Encounter (Signed)
  Pt c/o productive cough that started today. Pt stated she is coughing occasionally. No fever, SOB or difficulty breathing. No internation or high risk places in the [place] and no known exposure to Covid-19. Pt is coughing a small amount of white phlegm. Pt has chills but is afebrile. Pt had 1 episode of post tussive emesis today. Pt given precautions to wash hands, cough or sneeze on inside of elbow, told to stay inside, but if she goes out to keep 6 feet between herself and someone else, avoid crows, avoid close contact with people who are sick and to avoid touching eyes, face or mouth.  Home care advice given and pt verbalized understanding.        Reason for Disposition . Cough  Additional Information . Commented on: ALSO, mild vomiting occurs only when coughing    1 episode of post tussive emesis  Answer Assessment - Initial Assessment Questions 1. ONSET: "When did the cough begin?"      today 2. SEVERITY: "How bad is the cough today?"     Coughed a few times today 3. RESPIRATORY DISTRESS: "Describe your breathing."      No SOB or difficulty 4. FEVER: "Do you have a fever?" If so, ask: "What is your temperature, how was it measured, and when did it start?"     no 5. SPUTUM: "Describe the color of your sputum" (clear, white, yellow, green)     white 6. HEMOPTYSIS: "Are you coughing up any blood?" If so ask: "How much?" (flecks, streaks, tablespoons, etc.)     no 7. CARDIAC HISTORY: "Do you have any history of heart disease?" (e.g., heart attack, congestive heart failure)      no 8. LUNG HISTORY: "Do you have any history of lung disease?"  (e.g., pulmonary embolus, asthma, emphysema)     no 9. PE RISK FACTORS: "Do you have a history of blood clots?" (or: recent major surgery, recent prolonged travel, bedridden)     no 10. OTHER SYMPTOMS: "Do you have any other symptoms?" (e.g., runny nose, wheezing, chest pain)       Chills, vomited x 1 at 0900 this morning post tussive emesis  and Saturday felt nauseated and vomited 11. PREGNANCY: "Is there any chance you are pregnant?" "When was your last menstrual period?"       No on period now 3. TRAVEL: "Have you traveled out of the country in the last month?" (e.g., travel history, exposures)       no  Protocols used: COUGH - ACUTE PRODUCTIVE-A-AH

## 2018-06-20 ENCOUNTER — Other Ambulatory Visit: Payer: Self-pay

## 2018-06-20 ENCOUNTER — Emergency Department: Payer: Managed Care, Other (non HMO)

## 2018-06-20 ENCOUNTER — Emergency Department
Admission: EM | Admit: 2018-06-20 | Discharge: 2018-06-20 | Disposition: A | Discharge status: Home / Self Care | Payer: Managed Care, Other (non HMO) | Attending: Emergency Medicine | Admitting: Emergency Medicine

## 2018-06-20 ENCOUNTER — Encounter: Payer: Self-pay | Admitting: Emergency Medicine

## 2018-06-20 DIAGNOSIS — R202 Paresthesia of skin: Secondary | ICD-10-CM | POA: Insufficient documentation

## 2018-06-20 DIAGNOSIS — R079 Chest pain, unspecified: Secondary | ICD-10-CM | POA: Insufficient documentation

## 2018-06-20 LAB — BASIC METABOLIC PANEL
Anion gap: 8 (ref 5–15)
BUN: 7 mg/dL (ref 6–20)
CO2: 25 mmol/L (ref 22–32)
Calcium: 9.1 mg/dL (ref 8.9–10.3)
Chloride: 104 mmol/L (ref 98–111)
Creatinine, Ser: 0.66 mg/dL (ref 0.44–1.00)
GFR calc Af Amer: >60 mL/min (ref >60)
GFR calc non Af Amer: >60 mL/min (ref >60)
Glucose, Bld: 100 mg/dL — ABNORMAL HIGH (ref 70–99)
Potassium: 3.2 mmol/L — ABNORMAL LOW (ref 3.5–5.1)
Sodium: 137 mmol/L (ref 135–145)

## 2018-06-20 LAB — CBC
HCT: 38.9 % (ref 36.0–46.0)
Hemoglobin: 12.1 g/dL (ref 12.0–15.0)
MCH: 24.8 pg — ABNORMAL LOW (ref 26.0–34.0)
MCHC: 31.1 g/dL (ref 30.0–36.0)
MCV: 79.7 fL — ABNORMAL LOW (ref 80.0–100.0)
Platelets: 237 10*3/uL (ref 150–400)
RBC: 4.88 MIL/uL (ref 3.87–5.11)
RDW: 14.3 % (ref 11.5–15.5)
WBC: 7.4 10*3/uL (ref 4.0–10.5)
nRBC: 0 % (ref 0.0–0.2)

## 2018-06-20 LAB — TROPONIN I: Troponin I: <0.03 ng/mL (ref <0.03)

## 2018-06-20 MED ORDER — LIDOCAINE VISCOUS HCL 2 % MT SOLN
15.0000 mL | Freq: Once | OROMUCOSAL | Status: AC
  Administered 2018-06-20: 15 mL via ORAL
  Filled 2018-06-20: qty 15

## 2018-06-20 MED ORDER — HYDROXYZINE HCL 25 MG PO TABS: 25.0000 mg | ORAL_TABLET | Freq: Three times a day (TID) | ORAL | 0 refills | Status: DC | PRN

## 2018-06-20 MED ORDER — SODIUM CHLORIDE 0.9% FLUSH: 3.0000 mL | Freq: Once | INTRAVENOUS | Status: DC

## 2018-06-20 MED ORDER — ALUM & MAG HYDROXIDE-SIMETH 200-200-20 MG/5ML PO SUSP
30.0000 mL | Freq: Once | ORAL | Status: AC
  Administered 2018-06-20: 30 mL via ORAL
  Filled 2018-06-20: qty 30

## 2018-06-20 NOTE — ED Notes (Signed)
Pt verbalized understanding of discharge instructions. NAD at this time. 

## 2018-06-20 NOTE — ED Provider Notes (Signed)
[facility] Emergency Department Provider Note  Time seen: 11:11 AM  I have reviewed the triage vital signs and the nursing notes.   HISTORY  Chief Complaint Chest Pain  HPI Tina Francis is a 33 y.o. female with no significant past medical history presents to the emergency department for chest discomfort.  According to the patient yesterday she began experiencing tightness and pressure in the center of her chest and occasionally tingling in her left hand.  Patient states she has been under a lot of stress.  Patient states 2 years ago she lost her mother to cardiac arrest.  Yesterday her father suffered a cardiac arrest and is currently in an ICU in [place].  Patient is unable to return to visit him due to travel restrictions due to coronavirus.  Patient states she is been under a lot of stress due to this.  Denies any shortness of breath cough, congestion or fever.  Denies any pleuritic pain.  Describes her pain as a 5/10 tightness sensation.   Past Medical History:  Diagnosis Date  . Infertility   . Vacuum extractor delivery, delivered 04/29/2013    Patient Active Problem List   Diagnosis Date Noted  . Sore throat (viral) 03/16/2018  . PCOS (polycystic ovarian syndrome) 12/29/2017  . Obesity 12/26/2017  . Vacuum extractor delivery, delivered (2/12) 04/29/2013  . Allergic rhinitis 04/28/2010    Past Surgical History:  Procedure Laterality Date  . NO PAST SURGERIES      Prior to Admission medications   Medication Sig Start Date End Date Taking? Authorizing Provider  prochlorperazine (COMPAZINE) 10 MG tablet Take 1 tablet (10 mg total) by mouth every 8 (eight) hours as needed (headache). 05/28/18  Yes Phineas Semen, MD  cyclobenzaprine (FLEXERIL) 5 MG tablet Take 1 tablet (5 mg total) by mouth 3 (three) times daily as needed for muscle spasms. Patient not taking: Reported on 06/20/2018 05/25/18   Particia Nearing, PA-C  metFORMIN (GLUCOPHAGE) 500 MG  tablet Take 1 tablet (500 mg total) by mouth daily with breakfast. Patient not taking: Reported on 06/20/2018 12/29/17   Particia Nearing, PA-C    No Known Allergies  Family History  Problem Relation Age of Onset  . Heart attack Mother   . Hypertension Mother   . Diabetes Maternal Grandmother     Social History Social History   Tobacco Use  . Smoking status: Never Smoker  . Smokeless tobacco: Never Used  Substance Use Topics  . Alcohol use: No  . Drug use: No    Review of Systems Constitutional: Negative for fever. ENT: Negative for recent illness/congestion Cardiovascular: 5/10 central chest tightness Respiratory: Negative for shortness of breath. Gastrointestinal: Negative for abdominal pain, vomiting Musculoskeletal: Negative for musculoskeletal complaints Skin: Negative for skin complaints  Neurological: Negative for headache All other ROS negative  ____________________________________________   PHYSICAL EXAM:  VITAL SIGNS: ED Triage Vitals  Enc Vitals Group     BP 06/20/18 1009 118/83     Pulse Rate 06/20/18 1004 86     Resp 06/20/18 1004 18     Temp 06/20/18 1004 97.9 F (36.6 C)     Temp Source 06/20/18 1004 Oral     SpO2 06/20/18 1004 97 %     Weight 06/20/18 1007 180 lb (81.6 kg)     Height 06/20/18 1007 5\' 3"  (1.6 m)     Head Circumference --      Peak Flow --      Pain Score 06/20/18  1007 5     Pain Loc --      Pain Edu? --      Excl. in GC? --    Constitutional: Alert and oriented. Well appearing and in no distress. Eyes: Normal exam ENT   Head: Normocephalic and atraumatic.   Mouth/Throat: Mucous membranes are moist. Cardiovascular: Normal rate, regular rhythm. Respiratory: Normal respiratory effort without tachypnea nor retractions. Breath sounds are clear  Gastrointestinal: Soft and nontender. No distention.   Musculoskeletal: Nontender with normal range of motion in all extremities. Neurologic:  Normal speech and language.  No gross focal neurologic deficits Skin:  Skin is warm, dry and intact.  Psychiatric: Mood and affect are normal.   ____________________________________________    EKG  EKG viewed and interpreted by myself shows a normal sinus rhythm at 78 bpm with a narrow QRS, normal axis, normal intervals, no concerning ST changes.  ____________________________________________    RADIOLOGY  Chest x-ray negative  ____________________________________________   INITIAL IMPRESSION / ASSESSMENT AND PLAN / ED COURSE  Pertinent labs & imaging results that were available during my care of the patient were reviewed by me and considered in my medical decision making (see chart for details).  Patient presents emergency department for chest discomfort/tightness since yesterday with occasionally tingling in her left hand.  Differential would include ACS, stress, anxiety, chest wall discomfort, pneumonia, reactive airway disease.  Overall the patient appears extremely well, normal vitals, clear lung sounds without wheeze.  EKG is reassuring.  We will check labs, chest x-ray and continue to closely monitor.  We will also dose a GI cocktail as the patient states over the past 1 week she has had gastric reflux symptoms with increased belching.  Patient's work-up is reassuring including negative troponin.  Chest x-ray and EKG are reassuring.  Highly suspect stress to cause her discomfort however the patient states significant improvement after GI cocktail.  Discussed with patient using over-the-counter Maalox for discomfort we will also prescribe hydroxyzine if needed for the patient.  Patient agreeable plan of care.  Discussed my normal chest pain return precautions.  Tina Francis was evaluated in Emergency Department on 06/20/2018 for the symptoms described in the history of present illness. She was evaluated in the context of the global COVID-19 pandemic, which necessitated consideration that the patient might be  at risk for infection with the SARS-CoV-2 virus that causes COVID-19. Institutional protocols and algorithms that pertain to the evaluation of patients at risk for COVID-19 are in a state of rapid change based on information released by regulatory bodies including the CDC and federal and state organizations. These policies and algorithms were followed during the patient's care in the ED.   ____________________________________________   FINAL CLINICAL IMPRESSION(S) / ED DIAGNOSES  Chest pain   Minna Antis, MD 06/20/18 1148

## 2018-06-20 NOTE — Discharge Instructions (Signed)
Please use over-the-counter Maalox as needed for gastric reflux symptoms.  You may also use your prescribed hydroxyzine if needed for anxiety.  Please return to the emergency department for any return of/worsening chest discomfort, development of any shortness of breath, diaphoresis, or any other symptom personally concerning to yourself.

## 2018-06-20 NOTE — ED Triage Notes (Signed)
L chest pain x 1 week. States increased yesterday. States has been under increased stress since yesterday due to father being ill in [place].

## 2018-11-24 ENCOUNTER — Ambulatory Visit: Payer: Self-pay | Admitting: *Deleted

## 2018-11-24 NOTE — Telephone Encounter (Signed)
Appt made for tomorrow.

## 2018-11-24 NOTE — Telephone Encounter (Signed)
Tenderness on the right breast area for about a week. She denies nipple discharge, discoloration, dry flaky area or lumps. She also denies fever. She has not done anything to injury herself. She takes a half acetaminophen for discomfort which gets up to about a #7.  She is not breast feeding and LMP was the end of August. She normally has breast tenderness with her periods but she stated this time it did not go away. Per protocol she needs to be seen within 2 weeks but is requesting an appointment for sooner (today or tomorrow). Advised her to take the Acetominophen as needed for discomfort and checking temperature also. She voiced understanding. Notified [facility] for an appointment. Call transferred to the practice. Reason for Disposition . [1] Breast pain AND [2] cause is not known  Answer Assessment - Initial Assessment Questions 1. SYMPTOM: "What's the main symptom you're concerned about?"  (e.g., lump, pain, rash, nipple discharge)     Pain on top of the nipple 2. LOCATION: "Where is the right breast located?"     right 3. ONSET: "When did pain  start?"     The end of August 4. PRIOR HISTORY: "Do you have any history of prior problems with your breasts?" (e.g., lumps, cancer, fibrocystic breast disease)     no 5. CAUSE: "What do you think is causing this symptom?"     Not sure 6. OTHER SYMPTOMS: "Do you have any other symptoms?" (e.g., fever, breast pain, redness or rash, nipple discharge)     Whole breast  7. PREGNANCY-BREASTFEEDING: "Is there any chance you are pregnant?" "When was your last menstrual period?" "Are you breastfeeding?"     LMP the end of August and not breast feeding  Protocols used: BREAST [facility]

## 2018-11-25 ENCOUNTER — Ambulatory Visit (INDEPENDENT_AMBULATORY_CARE_PROVIDER_SITE_OTHER): Payer: Managed Care, Other (non HMO) | Admitting: Family Medicine

## 2018-11-25 ENCOUNTER — Encounter: Payer: Self-pay | Admitting: Family Medicine

## 2018-11-25 ENCOUNTER — Other Ambulatory Visit: Payer: Self-pay

## 2018-11-25 VITALS — BP 133/89 | HR 80 | Temp 98.5°F

## 2018-11-25 DIAGNOSIS — N644 Mastodynia: Secondary | ICD-10-CM | POA: Diagnosis not present

## 2018-11-25 MED ORDER — MELOXICAM 15 MG PO TABS: 15.0000 mg | ORAL_TABLET | Freq: Every day | ORAL | 0 refills | Status: DC

## 2018-11-25 NOTE — Progress Notes (Signed)
BP 133/89   Pulse 80   Temp 98.5 F (36.9 C) (Oral)   LMP 11/08/2018 (Exact Date)   SpO2 98%    Subjective:    Patient ID: Tina Francis, female    DOB: 1985/09/25, 33 y.o.   MRN: [id number]  HPI: Tina Francis is a 33 y.o. female  Chief Complaint  Patient presents with  . Breast Pain    R breast. Patient states her breast started hurting during her period like normal but they pain has not went away this time    Right breast pain that started about 2-3 weeks ago around the time of her period and has stayed. Notes she often has b/l breast tenderness during her menstrual cycles but it typically resolves once period ends. She is now having lateral breast pain radiating up into right shoulder that is worst with movement. Has tried some tylenol with some mild temporary relief. No redness, masses, known injury, swelling, numbness or tingling down right arm.   Relevant past medical, surgical, family and social history reviewed and updated as indicated. Interim medical history since our last visit reviewed. Allergies and medications reviewed and updated.  Review of Systems  Per HPI unless specifically indicated above     Objective:    BP 133/89   Pulse 80   Temp 98.5 F (36.9 C) (Oral)   LMP 11/08/2018 (Exact Date)   SpO2 98%   Wt Readings from Last 3 Encounters:  06/20/18 180 lb (81.6 kg)  05/28/18 180 lb (81.6 kg)  05/25/18 180 lb 11.2 oz (82 kg)    Physical Exam Vitals signs and nursing note reviewed.  Constitutional:      Appearance: Normal appearance. She is not ill-appearing.  HENT:     Head: Atraumatic.  Eyes:     Extraocular Movements: Extraocular movements intact.     Conjunctiva/sclera: Conjunctivae normal.  Neck:     Musculoskeletal: Normal range of motion and neck supple.  Cardiovascular:     Rate and Rhythm: Normal rate and regular rhythm.     Heart sounds: Normal heart sounds.  Pulmonary:     Effort: Pulmonary effort is normal.     Breath  sounds: Normal breath sounds.  Chest:     Breasts:        Right: Tenderness (laterally with continued ttp into axilla and right shoulder area) present. No mass, nipple discharge or skin change.        Left: No mass, nipple discharge, skin change or tenderness.  Musculoskeletal: Normal range of motion.  Skin:    General: Skin is warm and dry.  Neurological:     Mental Status: She is alert and oriented to person, place, and time.  Psychiatric:        Mood and Affect: Mood normal.        Thought Content: Thought content normal.        Judgment: Judgment normal.     Results for orders placed or performed during the hospital encounter of 81/27/51  Basic metabolic panel  Result Value Ref Range   Sodium 137 135 - 145 mmol/L   Potassium 3.2 (L) 3.5 - 5.1 mmol/L   Chloride 104 98 - 111 mmol/L   CO2 25 22 - 32 mmol/L   Glucose, Bld 100 (H) 70 - 99 mg/dL   BUN 7 6 - 20 mg/dL   Creatinine, Ser 0.66 0.44 - 1.00 mg/dL   Calcium 9.1 8.9 - 10.3 mg/dL   GFR calc non Af Amer >  60 >60 mL/min   GFR calc Af Amer >60 >60 mL/min   Anion gap 8 5 - 15  CBC  Result Value Ref Range   WBC 7.4 4.0 - 10.5 K/uL   RBC 4.88 3.87 - 5.11 MIL/uL   Hemoglobin 12.1 12.0 - 15.0 g/dL   HCT 40.938.9 81.136.0 - 91.446.0 %   MCV 79.7 (L) 80.0 - 100.0 fL   MCH 24.8 (L) 26.0 - 34.0 pg   MCHC 31.1 30.0 - 36.0 g/dL   RDW 78.214.3 95.611.5 - 21.315.5 %   Platelets 237 150 - 400 K/uL   nRBC 0.0 0.0 - 0.2 %  Troponin I - ONCE - STAT  Result Value Ref Range   Troponin I <0.03 <0.03 ng/mL      Assessment & Plan:   Problem List Items Addressed This Visit    None    Visit Diagnoses    Breast pain, right    -  Primary   Suspect muscular, will tx flexeril, massage, stretches, meloxicam and tylenol prn. Obtain breast imaging if not improving       Follow up plan: Return if symptoms worsen or fail to improve.

## 2019-03-11 ENCOUNTER — Encounter: Payer: Self-pay | Admitting: Family Medicine

## 2019-03-11 ENCOUNTER — Other Ambulatory Visit: Payer: Self-pay

## 2019-03-11 ENCOUNTER — Ambulatory Visit: Payer: Managed Care, Other (non HMO) | Admitting: Family Medicine

## 2019-03-11 VITALS — BP 118/78 | HR 84 | Temp 98.3°F | Ht 63.0 in | Wt 182.0 lb

## 2019-03-11 DIAGNOSIS — N926 Irregular menstruation, unspecified: Secondary | ICD-10-CM | POA: Diagnosis not present

## 2019-03-11 LAB — PREGNANCY, URINE: Preg Test, Ur: NEGATIVE

## 2019-03-11 NOTE — Progress Notes (Signed)
BP 118/78   Pulse 84   Temp 98.3 F (36.8 C) (Oral)   Ht 5\' 3"  (1.6 m)   Wt 182 lb (82.6 kg)   SpO2 98%   BMI 32.24 kg/m    Subjective:    Patient ID: Tina Francis, female    DOB: 11/28/1985, 33 y.o.   MRN: [id number]  HPI: Tina Francis is a 33 y.o. female  Chief Complaint  Patient presents with  . Late Period    late 7 days   Patient presenting today concerned about missing her period. Period is late 1 week, which she states is incredibly rare for her as she is typically on regular schedule or early by a few days. Having some breast tenderness but no other associated sxs. Took a home pregnancy test last week which was negative, and states she has not had intercourse in the past month so does not feel pregnancy is a significant possibility. Denies pelvic pain, N/V, recent medication changes, stressors, diet changes. Does have a hx of PCOS.   Relevant past medical, surgical, family and social history reviewed and updated as indicated. Interim medical history since our last visit reviewed. Allergies and medications reviewed and updated.  Review of Systems  Per HPI unless specifically indicated above     Objective:    BP 118/78   Pulse 84   Temp 98.3 F (36.8 C) (Oral)   Ht 5\' 3"  (1.6 m)   Wt 182 lb (82.6 kg)   SpO2 98%   BMI 32.24 kg/m   Wt Readings from Last 3 Encounters:  03/11/19 182 lb (82.6 kg)  06/20/18 180 lb (81.6 kg)  05/28/18 180 lb (81.6 kg)    Physical Exam Vitals and nursing note reviewed.  Constitutional:      Appearance: Normal appearance. She is not ill-appearing.  HENT:     Head: Atraumatic.  Eyes:     Extraocular Movements: Extraocular movements intact.     Conjunctiva/sclera: Conjunctivae normal.  Cardiovascular:     Rate and Rhythm: Normal rate and regular rhythm.     Heart sounds: Normal heart sounds.  Pulmonary:     Effort: Pulmonary effort is normal.     Breath sounds: Normal breath sounds.  Abdominal:     General: Bowel  sounds are normal. There is no distension.     Palpations: Abdomen is soft.     Tenderness: There is no abdominal tenderness. There is no guarding.  Musculoskeletal:        General: Normal range of motion.     Cervical back: Normal range of motion and neck supple.  Skin:    General: Skin is warm and dry.  Neurological:     Mental Status: She is alert and oriented to person, place, and time.  Psychiatric:        Mood and Affect: Mood normal.        Thought Content: Thought content normal.        Judgment: Judgment normal.     Results for orders placed or performed in visit on 03/11/19  Pregnancy, urine  Result Value Ref Range   Preg Test, Ur Negative Negative      Assessment & Plan:   Problem List Items Addressed This Visit    None    Visit Diagnoses    Missed period    -  Primary   Urine preg negative, exam benign. Will continue to monitor, discussed that occasional variance is not always cause for concern.  Relevant Orders   Pregnancy, urine (Completed)       Follow up plan: Return if symptoms worsen or fail to improve.

## 2019-03-18 ENCOUNTER — Encounter: Payer: Managed Care, Other (non HMO) | Admitting: Family Medicine

## 2019-07-08 ENCOUNTER — Telehealth: Payer: Managed Care, Other (non HMO) | Admitting: Family Medicine

## 2019-07-08 ENCOUNTER — Ambulatory Visit (INDEPENDENT_AMBULATORY_CARE_PROVIDER_SITE_OTHER): Payer: Managed Care, Other (non HMO) | Admitting: Family Medicine

## 2019-07-08 ENCOUNTER — Encounter: Payer: Self-pay | Admitting: Family Medicine

## 2019-07-08 VITALS — Temp 97.2°F | Wt 180.0 lb

## 2019-07-08 DIAGNOSIS — J029 Acute pharyngitis, unspecified: Secondary | ICD-10-CM

## 2019-07-08 MED ORDER — LIDOCAINE VISCOUS HCL 2 % MT SOLN: 5.0000 mL | OROMUCOSAL | 0 refills | Status: DC | PRN

## 2019-07-08 NOTE — Progress Notes (Signed)
   Temp (!) 97.2 F (36.2 C) (Temporal)   Wt 180 lb (81.6 kg)   BMI 31.89 kg/m    Subjective:    Patient ID: Tina Francis, female    DOB: Oct 21, 1985, 34 y.o.   MRN: [id number]  HPI: Tina Francis is a 34 y.o. female  Chief Complaint  Patient presents with  . Sore Throat    x 2 days    . This visit was completed via telephone due to the restrictions of the COVID-19 pandemic. All issues as above were discussed and addressed. Physical exam was done as above through visual confirmation on telephone. If it was felt that the patient should be evaluated in the office, they were directed there. The patient verbally consented to this visit. . Location of the patient: home . Location of the provider: work . Those involved with this call:  . Provider: Roosvelt Maser, PA-C . CMA: Elton Sin, CMA . Front Desk/Registration: Harriet Pho  . Time spent on call: 15 minutes on the phone discussing health concerns. 5 minutes total spent in review of patient's record and preparation of their chart. I verified patient identity using two factors (patient name and date of birth). Patient consents verbally to being seen via telemedicine visit today.   Sore throat starting about 2 days ago, very painful and swollen. No other associated sxs at this time, including fever, chills, congestion, cough, CP, SOB. Son was sick with cough and cold last week and was COVID negative. Not taking anything for sxs.   Relevant past medical, surgical, family and social history reviewed and updated as indicated. Interim medical history since our last visit reviewed. Allergies and medications reviewed and updated.  Review of Systems  Per HPI unless specifically indicated above     Objective:    Temp (!) 97.2 F (36.2 C) (Temporal)   Wt 180 lb (81.6 kg)   BMI 31.89 kg/m   Wt Readings from Last 3 Encounters:  07/08/19 180 lb (81.6 kg)  03/11/19 182 lb (82.6 kg)  06/20/18 180 lb (81.6 kg)    Physical  Exam  Unable to perform PE due to patient lack of access to video technology for visit  Results for orders placed or performed in visit on 03/11/19  Pregnancy, urine  Result Value Ref Range   Preg Test, Ur Negative Negative      Assessment & Plan:   Problem List Items Addressed This Visit    None    Visit Diagnoses    Sore throat    -  Primary   Await COVID testing, start viscous lidocaine and OTC cold medications prn. F/u if worsening or not improving   Relevant Orders   Novel Coronavirus, NAA ([facility])       Follow up plan: Return if symptoms worsen or fail to improve.

## 2019-07-09 ENCOUNTER — Ambulatory Visit: Payer: Managed Care, Other (non HMO) | Attending: Internal Medicine

## 2019-08-15 ENCOUNTER — Emergency Department
Admission: EM | Admit: 2019-08-15 | Discharge: 2019-08-15 | Disposition: A | Discharge status: Home / Self Care | Payer: Managed Care, Other (non HMO) | Attending: Emergency Medicine | Admitting: Emergency Medicine

## 2019-08-15 ENCOUNTER — Other Ambulatory Visit: Payer: Self-pay

## 2019-08-15 ENCOUNTER — Encounter: Payer: Self-pay | Admitting: Emergency Medicine

## 2019-08-15 ENCOUNTER — Emergency Department: Payer: Managed Care, Other (non HMO)

## 2019-08-15 DIAGNOSIS — Z79899 Other long term (current) drug therapy: Secondary | ICD-10-CM | POA: Insufficient documentation

## 2019-08-15 DIAGNOSIS — R0789 Other chest pain: Secondary | ICD-10-CM

## 2019-08-15 LAB — BASIC METABOLIC PANEL
Anion gap: 8 (ref 5–15)
BUN: 11 mg/dL (ref 6–20)
CO2: 24 mmol/L (ref 22–32)
Calcium: 9.2 mg/dL (ref 8.9–10.3)
Chloride: 107 mmol/L (ref 98–111)
Creatinine, Ser: 0.63 mg/dL (ref 0.44–1.00)
GFR calc Af Amer: >60 mL/min (ref >60)
GFR calc non Af Amer: >60 mL/min (ref >60)
Glucose, Bld: 97 mg/dL (ref 70–99)
Potassium: 3.5 mmol/L (ref 3.5–5.1)
Sodium: 139 mmol/L (ref 135–145)

## 2019-08-15 LAB — CBC
HCT: 38.7 % (ref 36.0–46.0)
Hemoglobin: 12.2 g/dL (ref 12.0–15.0)
MCH: 24.7 pg — ABNORMAL LOW (ref 26.0–34.0)
MCHC: 31.5 g/dL (ref 30.0–36.0)
MCV: 78.5 fL — ABNORMAL LOW (ref 80.0–100.0)
Platelets: 227 10*3/uL (ref 150–400)
RBC: 4.93 MIL/uL (ref 3.87–5.11)
RDW: 14.7 % (ref 11.5–15.5)
WBC: 6.6 10*3/uL (ref 4.0–10.5)
nRBC: 0 % (ref 0.0–0.2)

## 2019-08-15 LAB — POCT PREGNANCY, URINE: Preg Test, Ur: NEGATIVE

## 2019-08-15 LAB — TROPONIN I (HIGH SENSITIVITY): Troponin I (High Sensitivity): <2 ng/L (ref <18)

## 2019-08-15 MED ORDER — IOHEXOL 350 MG/ML SOLN
75.0000 mL | Freq: Once | INTRAVENOUS | Status: AC | PRN
  Administered 2019-08-15: 75 mL via INTRAVENOUS

## 2019-08-15 MED ORDER — SODIUM CHLORIDE 0.9% FLUSH: 3.0000 mL | Freq: Once | INTRAVENOUS | Status: DC

## 2019-08-15 NOTE — ED Notes (Signed)
CT called and updated that pt is ready for CT

## 2019-08-15 NOTE — ED Notes (Signed)
Peripheral IV discontinued. Catheter intact. No signs of infiltration or redness. Gauze applied to IV site.   Discharge instructions reviewed with patient. Questions fielded by this RN. Patient verbalizes understanding of instructions. Patient discharged home in stable condition per kinner . No acute distress noted at time of discharge.   Pt ambulatory to DC

## 2019-08-15 NOTE — ED Provider Notes (Signed)
[facility] Emergency Department Provider Note   ____________________________________________    I have reviewed the triage vital signs and the nursing notes.   HISTORY  Chief Complaint Chest Pain     HPI Tina Francis is a 34 y.o. female who presents with complaints of chest pain.  Patient reports yesterday she was hiking and on the way [place] she developed chest discomfort which seemed to improve somewhat with rest but continues mildly today.  No history of heart disease however she does have extensive family history in her father and mother.  Denies fevers chills or cough.  No significant shortness of breath.  Does note that her heart rate was extremely high yesterday while hiking based on her apple watch.  Continues to have pain today primarily in her left chest as well as her left lateral chest.  Not on hormones, no history of blood clots.  Past Medical History:  Diagnosis Date  . Infertility   . Vacuum extractor delivery, delivered 04/29/2013    Patient Active Problem List   Diagnosis Date Noted  . Sore throat (viral) 03/16/2018  . PCOS (polycystic ovarian syndrome) 12/29/2017  . Obesity 12/26/2017  . Vacuum extractor delivery, delivered (2/12) 04/29/2013  . Allergic rhinitis 04/28/2010    Past Surgical History:  Procedure Laterality Date  . NO PAST SURGERIES      Prior to Admission medications   Medication Sig Start Date End Date Taking? Authorizing Provider  lidocaine (XYLOCAINE) 2 % solution Use as directed 5 mLs in the mouth or throat as needed for mouth pain. 07/08/19   Volney American, PA-C     Allergies Patient has no known allergies.  Family History  Problem Relation Age of Onset  . Heart attack Mother   . Hypertension Mother   . Diabetes Maternal Grandmother     Social History Social History   Tobacco Use  . Smoking status: Never Smoker  . Smokeless tobacco: Never Used  Substance Use  Topics  . Alcohol use: No  . Drug use: No    Review of Systems  Constitutional: No fever/chills Eyes: No visual changes.  ENT: No sore throat. Cardiovascular: As above Respiratory: As above Gastrointestinal: No abdominal pain.  No nausea, no vomiting.   Genitourinary: Negative for dysuria. Musculoskeletal: Negative for back pain. Skin: Negative for rash. Neurological: Negative for headaches or weakness   ____________________________________________   PHYSICAL EXAM:  VITAL SIGNS: ED Triage Vitals  Enc Vitals Group     BP 08/15/19 1411 (!) 142/84     Pulse Rate 08/15/19 1411 83     Resp 08/15/19 1411 18     Temp 08/15/19 1411 98.1 F (36.7 C)     Temp Source 08/15/19 1411 Oral     SpO2 08/15/19 1411 98 %     Weight 08/15/19 1410 69.4 kg (153 lb)     Height 08/15/19 1410 1.6 m (5\' 3" )     Head Circumference --      Peak Flow --      Pain Score 08/15/19 1410 5     Pain Loc --      Pain Edu? --      Excl. in [place]? --     Constitutional: Alert and oriented.   Nose: No congestion/rhinnorhea. Mouth/Throat: Mucous membranes are moist.   Neck:  Painless ROM Cardiovascular: Normal rate, regular rhythm. Grossly normal heart sounds.  Good peripheral circulation. Respiratory: Normal respiratory effort.  No retractions.  Gastrointestinal: Soft and nontender. No distention.    Musculoskeletal: No lower extremity tenderness nor edema.  Warm and well perfused Neurologic:  Normal speech and language. No gross focal neurologic deficits are appreciated.  Skin:  Skin is warm, dry and intact. No rash noted. Psychiatric: Mood and affect are normal. Speech and behavior are normal.  ____________________________________________   LABS (all labs ordered are listed, but only abnormal results are displayed)  Labs Reviewed  CBC - Abnormal; Notable for the following components:      Result Value   MCV 78.5 (*)    MCH 24.7 (*)    All other components within normal limits  BASIC  METABOLIC PANEL  POC URINE PREG, ED  POCT PREGNANCY, URINE  TROPONIN I (HIGH SENSITIVITY)   ____________________________________________  EKG  ED ECG REPORT I, Jene Every, the attending physician, personally viewed and interpreted this ECG.  Date: 08/15/2019  Rhythm: normal sinus rhythm QRS Axis: normal Intervals: Incomplete right bundle ST/T Wave abnormalities: normal Narrative Interpretation: no evidence of acute ischemia  ____________________________________________  RADIOLOGY  Chest x-ray remarkable ED angiography negative for PE ____________________________________________   PROCEDURES  Procedure(s) performed: No  Procedures   Critical Care performed: No ____________________________________________   INITIAL IMPRESSION / ASSESSMENT AND PLAN / ED COURSE  Pertinent labs & imaging results that were available during my care of the patient were reviewed by me and considered in my medical decision making (see chart for details).  Patient presents with chest pain as described above, given her age she is low risk for ACS and EKG is overall reassuring, incomplete right bundle noted likely chronic.  High-sensitivity troponin is undetectable which is quite reassuring.  Differential also includes musculoskeletal pain, pneumonia, less likely PE  Sent for CT angiography negative for PE or pneumonia.  Patient is feeling better   ----------------------------------------- 6:49 PM on 08/15/2019 -----------------------------------------  Patient feeling much better, appropriate for discharge at this time, however will have her follow-up closely with cardiology, strict return precautions discussed    ____________________________________________   FINAL CLINICAL IMPRESSION(S) / ED DIAGNOSES  Final diagnoses:  Atypical chest pain        Note:  This document was prepared using Dragon voice recognition software and may include unintentional dictation errors.    Jene Every, MD 08/15/19 [phone]

## 2019-08-15 NOTE — ED Triage Notes (Signed)
PT to ER states she was hiking yesterday and today she is having discomfort to left arm and chest.  PT denies other radiation, denies n/v, states breathing is "heavy".  States she is "burping" constantly.

## 2019-10-18 IMAGING — DX CHEST  1 VIEW
1 series · 1 of 1 positions shown · non-contrast
Comparison: None.

CLINICAL DATA: Chest pain since yesterday.

EXAM:
CHEST  1 VIEW

[chest ap]
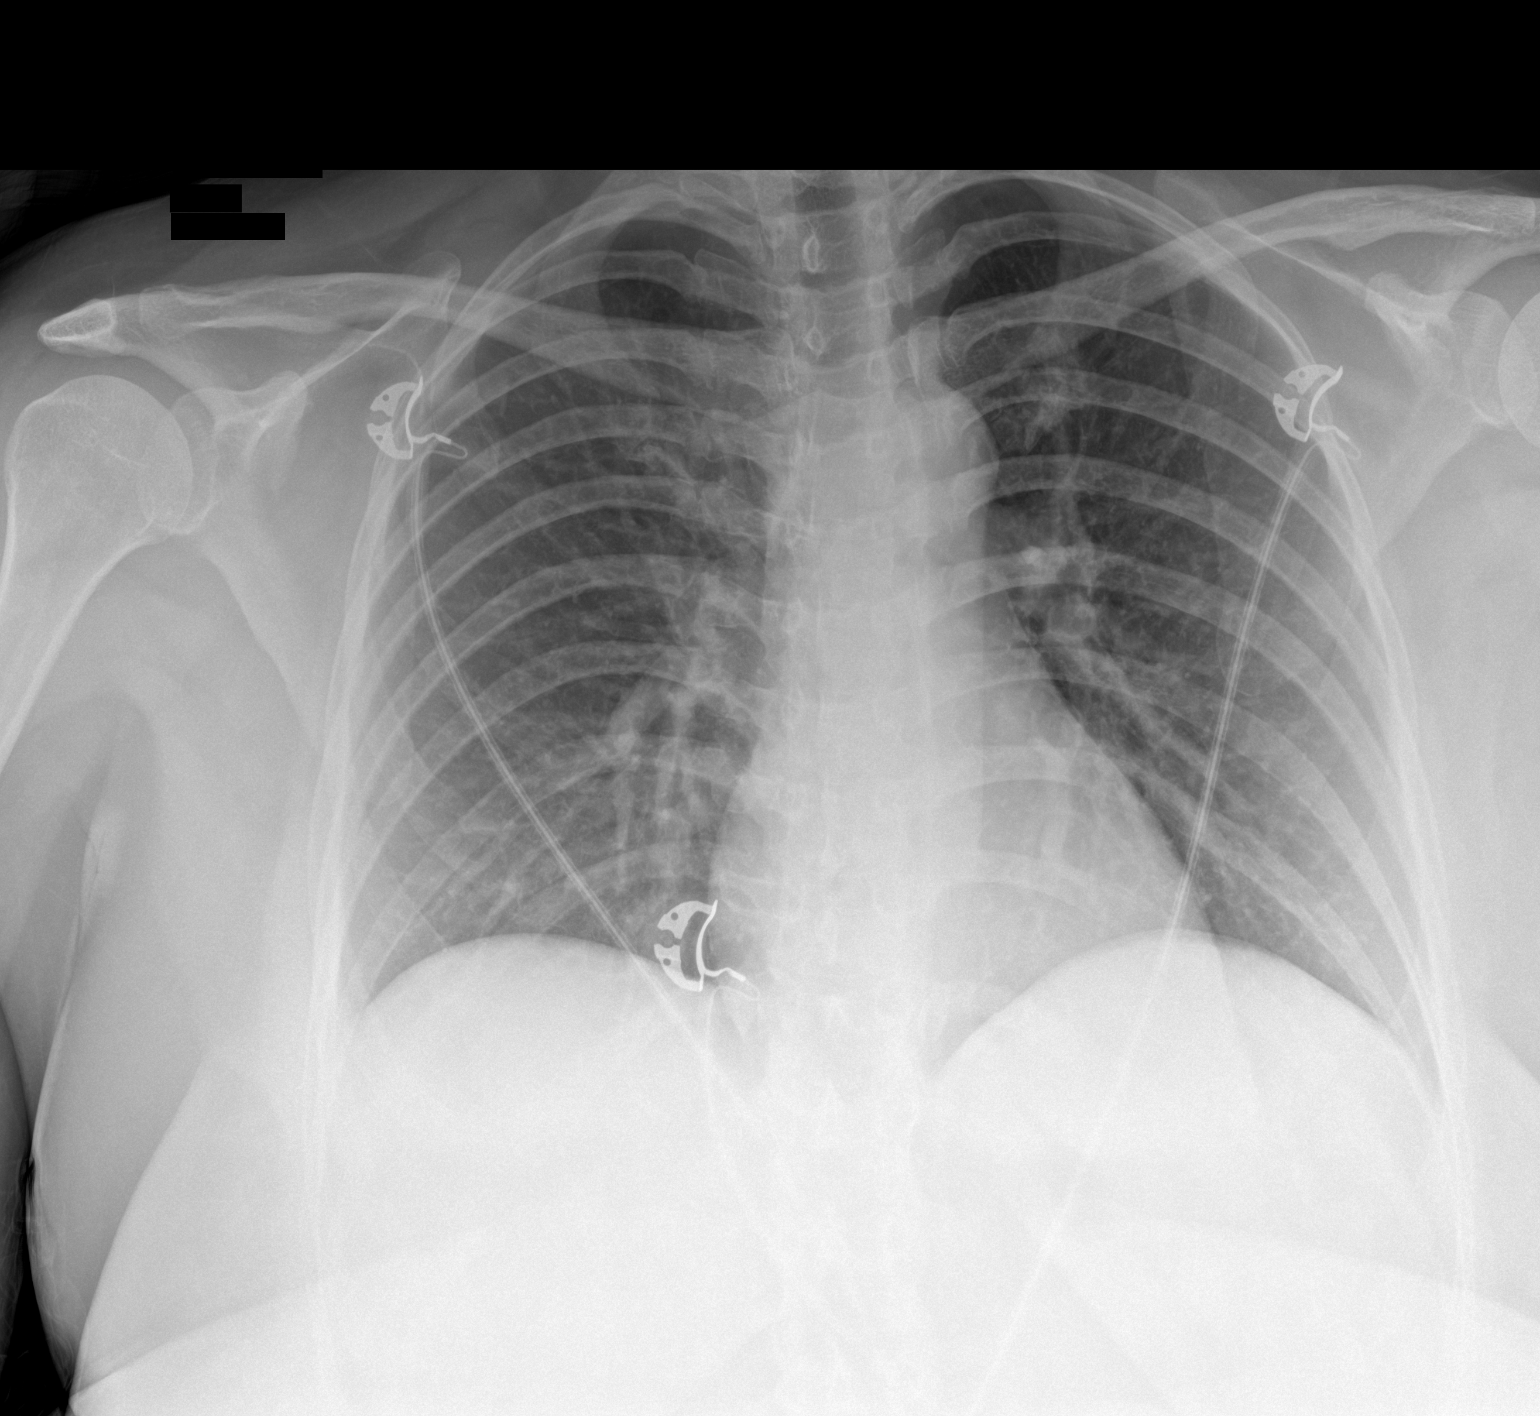

[1 of 1 positions shown; findings below may reference images not displayed]

FINDINGS: Normal heart, mediastinum and hila.

Lungs are clear.  No pleural effusion or pneumothorax.

Skeletal structures are unremarkable.
IMPRESSION: No active disease.

## 2019-11-10 ENCOUNTER — Other Ambulatory Visit: Payer: Self-pay

## 2019-11-10 ENCOUNTER — Other Ambulatory Visit: Payer: Managed Care, Other (non HMO)

## 2019-11-10 DIAGNOSIS — Z20822 Contact with and (suspected) exposure to covid-19: Secondary | ICD-10-CM

## 2019-11-12 LAB — NOVEL CORONAVIRUS, NAA: SARS-CoV-2, NAA: NOT DETECTED

## 2019-11-12 LAB — SARS-COV-2, NAA 2 DAY TAT

## 2020-03-15 ENCOUNTER — Ambulatory Visit (INDEPENDENT_AMBULATORY_CARE_PROVIDER_SITE_OTHER): Payer: Managed Care, Other (non HMO) | Admitting: Family Medicine

## 2020-03-15 ENCOUNTER — Other Ambulatory Visit: Payer: Self-pay

## 2020-03-15 ENCOUNTER — Encounter: Payer: Self-pay | Admitting: Family Medicine

## 2020-03-15 VITALS — BP 127/83 | HR 73 | Temp 98.0°F | Ht 61.9 in | Wt 182.6 lb

## 2020-03-15 DIAGNOSIS — Z23 Encounter for immunization: Secondary | ICD-10-CM | POA: Diagnosis not present

## 2020-03-15 DIAGNOSIS — E6609 Other obesity due to excess calories: Secondary | ICD-10-CM

## 2020-03-15 DIAGNOSIS — Z6833 Body mass index (BMI) 33.0-33.9, adult: Secondary | ICD-10-CM | POA: Diagnosis not present

## 2020-03-15 DIAGNOSIS — R0602 Shortness of breath: Secondary | ICD-10-CM | POA: Diagnosis not present

## 2020-03-15 NOTE — Assessment & Plan Note (Signed)
Counseled on weight loss through diet and exercise, handout provided. Was prediabetic during prior pregnancy, checking a1c and lipid panel today.

## 2020-03-15 NOTE — Progress Notes (Signed)
BP 127/83   Pulse 73   Temp 98 F (36.7 C) (Oral)   Ht 5' 1.9" (1.572 m)   Wt 182 lb 9.6 oz (82.8 kg)   LMP 03/02/2020 (Exact Date)   SpO2 100%   BMI 33.51 kg/m    Subjective:    Patient ID: Tina Francis, female    DOB: Mar 09, 1986, 34 y.o.   MRN: [id number]  HPI: Tina Francis is a 34 y.o. female presenting on 03/15/2020 for comprehensive medical examination. Current medical complaints include:   Difficult to focus at night, hasn't seen eye doctor. Does not wear glasses or contacts. No headaches, flashing lights, floaters.   Reproductive History - G1P1001 - Menses: regular, every 28 days, heavy flow, lasts 7 days. - Contraception: none, wants to try to get pregnant. - Cancer screening: UTD  She currently lives with: husband, son Menopausal Symptoms: no  Depression Screen done today and results listed below:  Depression screen [facility] 2/9 03/15/2020 03/16/2018 03/16/2018 03/14/2017 03/14/2017  Decreased Interest 0 0 0 0 0  Down, Depressed, Hopeless 0 0 0 0 0  PHQ - 2 Score 0 0 0 0 0  Altered sleeping - 1 1 1  -  Tired, decreased energy - 0 0 0 -  Change in appetite - 0 0 0 -  Feeling bad or failure about yourself  - 0 0 0 -  Trouble concentrating - 0 0 0 -  Moving slowly or fidgety/restless - 0 0 0 -  Suicidal thoughts - 0 0 0 -  PHQ-9 Score - 1 1 1  -  Difficult doing work/chores - - Not difficult at all Not difficult at all -    The patient does not have a history of falls. I did not complete a risk assessment for falls. A plan of care for falls was not documented.   Past Medical History:  Past Medical History:  Diagnosis Date  . Infertility   . Vacuum extractor delivery, delivered 04/29/2013    Surgical History:  Past Surgical History:  Procedure Laterality Date  . NO PAST SURGERIES      Medications:  No current outpatient medications on file prior to visit.   No current facility-administered medications on file prior to visit.    Allergies:   No Known Allergies  Social History:  Social History   Socioeconomic History  . Marital status: Married    Spouse name: Not on file  . Number of children: Not on file  . Years of education: Not on file  . Highest education level: Not on file  Occupational History  . Not on file  Tobacco Use  . Smoking status: Never Smoker  . Smokeless tobacco: Never Used  Vaping Use  . Vaping Use: Never used  Substance and Sexual Activity  . Alcohol use: No  . Drug use: No  . Sexual activity: Yes  Other Topics Concern  . Not on file  Social History Narrative  . Not on file   Social Determinants of Health   Financial Resource Strain: Not on file  Food Insecurity: Not on file  Transportation Needs: Not on file  Physical Activity: Not on file  Stress: Not on file  Social Connections: Not on file  Intimate Partner Violence: Not on file   Social History   Tobacco Use  Smoking Status Never Smoker  Smokeless Tobacco Never Used   Social History   Substance and Sexual Activity  Alcohol Use No    Family History:  Family History  Problem Relation Age of Onset  . Heart attack Mother   . Hypertension Mother   . Diabetes Maternal Grandmother   . Heart attack Father   . Autism spectrum disorder Son     Past medical history, surgical history, medications, allergies, family history and social history reviewed with patient today and changes made to appropriate areas of the chart.   Review of Systems - denies chest pain, difficulties breathing, difficulties eating/drinking/voiding/stooling, leg edema. All other ROS negative except what is listed above and in the HPI.      Objective:    BP 127/83   Pulse 73   Temp 98 F (36.7 C) (Oral)   Ht 5' 1.9" (1.572 m)   Wt 182 lb 9.6 oz (82.8 kg)   LMP 03/02/2020 (Exact Date)   SpO2 100%   BMI 33.51 kg/m   Wt Readings from Last 3 Encounters:  03/15/20 182 lb 9.6 oz (82.8 kg)  08/15/19 153 lb (69.4 kg)  07/08/19 180 lb (81.6 kg)     Physical Exam Constitutional:      Appearance: Normal appearance.  HENT:     Head: Normocephalic.     Right Ear: External ear normal.     Left Ear: External ear normal.     Nose: Nose normal.     Mouth/Throat:     Mouth: Mucous membranes are moist.     Pharynx: Oropharynx is clear.  Eyes:     Extraocular Movements: Extraocular movements intact.     Pupils: Pupils are equal, round, and reactive to light.  Cardiovascular:     Rate and Rhythm: Normal rate and regular rhythm.     Heart sounds: Normal heart sounds. No murmur heard.   Pulmonary:     Effort: Pulmonary effort is normal.     Breath sounds: Normal breath sounds.  Abdominal:     General: Bowel sounds are normal.     Palpations: Abdomen is soft.  Musculoskeletal:        General: Normal range of motion.     Right lower leg: No edema.     Left lower leg: No edema.  Skin:    General: Skin is warm and dry.  Neurological:     General: No focal deficit present.     Mental Status: She is alert and oriented to person, place, and time.  Psychiatric:        Mood and Affect: Mood normal.        Behavior: Behavior normal.     Results for orders placed or performed in visit on 11/10/19  Novel Coronavirus, NAA ([facility])   Specimen: Nasopharyngeal(NP) swabs in vial transport medium   Nasopharynge  Screenin  Result Value Ref Range   SARS-CoV-2, NAA Not Detected Not Detected  SARS-COV-2, NAA 2 DAY TAT   Nasopharynge  Screenin  Result Value Ref Range   SARS-CoV-2, NAA 2 DAY TAT Performed       Assessment & Plan:   Problem List Items Addressed This Visit      Other   Obesity    Counseled on weight loss through diet and exercise, handout provided. Was prediabetic during prior pregnancy, checking a1c and lipid panel today.      Relevant Orders   Lipid panel   Bayer DCA Hb A1c Waived (STAT)    Other Visit Diagnoses    Need for influenza vaccination    -  Primary   Relevant Orders   Flu Vaccine QUAD 36+ mos IM  Shortness of breath       Relevant Orders   CBC       Follow up plan: Return in about 1 year (around 03/15/2021).   LABORATORY TESTING:  - Pap smear: up to date  IMMUNIZATIONS:   - Tdap: Tetanus vaccination status reviewed: last tetanus booster within 10 years. - Influenza: Administered today - Pneumovax: Not applicable - Prevnar: Not applicable - HPV: Refused - Shingrix vaccine: Not applicable  - COVID - UTD, due for booster  SCREENING: -Mammogram: Not applicable  - Colonoscopy: Not applicable  - Bone Density: Not applicable   PATIENT COUNSELING:   Advised to take 1 mg of folate supplement per day if capable of pregnancy.   Sexuality: Discussed sexually transmitted diseases, partner selection, use of condoms, avoidance of unintended pregnancy  and contraceptive alternatives.   Advised to avoid cigarette smoking.  I discussed with the patient that most people either abstain from alcohol or drink within safe limits (<=14/week and <=4 drinks/occasion for males, <=7/weeks and <= 3 drinks/occasion for females) and that the risk for alcohol disorders and other health effects rises proportionally with the number of drinks per week and how often a drinker exceeds daily limits.  Discussed cessation/primary prevention of drug use and availability of treatment for abuse.   Diet: Encouraged to adjust caloric intake to maintain  or achieve ideal body weight, to reduce intake of dietary saturated fat and total fat, to limit sodium intake by avoiding high sodium foods and not adding table salt, and to maintain adequate dietary potassium and calcium preferably from fresh fruits, vegetables, and low-fat dairy products.    Stressed the importance of regular exercise.  Injury prevention: Discussed safety belts, safety helmets, smoke detector, smoking near bedding or upholstery.   Dental health: Discussed importance of regular tooth brushing, flossing, and dental visits.   NEXT  PREVENTATIVE PHYSICAL DUE IN 1 YEAR. Return in about 1 year (around 03/15/2021).

## 2020-03-15 NOTE — Patient Instructions (Addendum)
It was great to see you!  Our plans for today:  - It is recommended to get 20 minutes per day of moderate exercise (anything that raises your heart rate) at least 5 times per week.  - See below for tips on a healthy diet.  - Take a prenatal vitamin when you decide you want to get pregnant. I recommend trying to lose weight before getting pregnant to help with getting pregnant as well as setting you up for a healthy pregnancy.  - Make sure you see a dentist at least once per year. - Make an appointment to get your eyes checked.  We are checking some labs today, we will release these results to your MyChart.  Take care and seek immediate care sooner if you develop any concerns.   Dr. Linwood Dibbles  Here is an example of what a healthy plate looks like:    ? Make half your plate fruits and vegetables.     ? Focus on whole fruits.     ? Vary your veggies.  ? Make half your grains whole grains. -     ? Look for the word "whole" at the beginning of the ingredients list    ? Some whole-grain ingredients include whole oats, whole-wheat flour,        whole-grain corn, whole-grain brown rice, and whole rye.  ? Move to low-fat and fat-free milk or yogurt.  ? Vary your protein routine. - Meat, fish, poultry (chicken, [place]), eggs, beans (kidney, pinto), dairy.  ? Drink and eat less sodium, saturated fat, and added sugars.    Influenza (Flu) Vaccine (Inactivated or Recombinant): What You Need to Know 1. Why get vaccinated? Influenza vaccine can prevent influenza (flu). Flu is a contagious disease that spreads around the [place] every year, usually between October and May. Anyone can get the flu, but it is more dangerous for some people. Infants and young children, people 79 years of age and older, pregnant women, and people with certain health conditions or a weakened immune system are at greatest risk of flu complications. Pneumonia, bronchitis, sinus infections and ear infections are  examples of flu-related complications. If you have a medical condition, such as heart disease, cancer or diabetes, flu can make it worse. Flu can cause fever and chills, sore throat, muscle aches, fatigue, cough, headache, and runny or stuffy nose. Some people may have vomiting and diarrhea, though this is more common in children than adults. Each year thousands of people in the [place] die from flu, and many more are hospitalized. Flu vaccine prevents millions of illnesses and flu-related visits to the doctor each year. 2. Influenza vaccine CDC recommends everyone 90 months of age and older get vaccinated every flu season. Children 6 months through 42 years of age may need 2 doses during a single flu season. Everyone else needs only 1 dose each flu season. It takes about 2 weeks for protection to develop after vaccination. There are many flu viruses, and they are always changing. Each year a new flu vaccine is made to protect against three or four viruses that are likely to cause disease in the upcoming flu season. Even when the vaccine doesn't exactly match these viruses, it may still provide some protection. Influenza vaccine does not cause flu. Influenza vaccine may be given at the same time as other vaccines. 3. Talk with your health care provider Tell your vaccine provider if the person getting the vaccine:  Has had an allergic reaction after  a previous dose of influenza vaccine, or has any severe, life-threatening allergies.  Has ever had Guillain-Barr Syndrome (also called GBS). In some cases, your health care provider may decide to postpone influenza vaccination to a future visit. People with minor illnesses, such as a cold, may be vaccinated. People who are moderately or severely ill should usually wait until they recover before getting influenza vaccine. Your health care provider can give you more information. 4. Risks of a vaccine reaction  Soreness, redness, and swelling where  shot is given, fever, muscle aches, and headache can happen after influenza vaccine.  There may be a very small increased risk of Guillain-Barr Syndrome (GBS) after inactivated influenza vaccine (the flu shot). Young children who get the flu shot along with pneumococcal vaccine (PCV13), and/or DTaP vaccine at the same time might be slightly more likely to have a seizure caused by fever. Tell your health care provider if a child who is getting flu vaccine has ever had a seizure. People sometimes faint after medical procedures, including vaccination. Tell your provider if you feel dizzy or have vision changes or ringing in the ears. As with any medicine, there is a very remote chance of a vaccine causing a severe allergic reaction, other serious injury, or death. 5. What if there is a serious problem? An allergic reaction could occur after the vaccinated person leaves the clinic. If you see signs of a severe allergic reaction (hives, swelling of the face and throat, difficulty breathing, a fast heartbeat, dizziness, or weakness), call 9-1-1 and get the person to the nearest hospital. For other signs that concern you, call your health care provider. Adverse reactions should be reported to the Vaccine Adverse Event Reporting System (VAERS). Your health care provider will usually file this report, or you can do it yourself. Visit the VAERS website at www.vaers.LAgents.no or call (203)876-9861.VAERS is only for reporting reactions, and VAERS staff do not give medical advice. 6. The National Vaccine Injury Compensation Program The [employer] Vaccine Injury Compensation Program (VICP) is a federal program that was created to compensate people who may have been injured by certain vaccines. Visit the VICP website at SpiritualWord.at or call 214-043-1389 to learn about the program and about filing a claim. There is a time limit to file a claim for compensation. 7. How can I learn more?  Ask your  healthcare provider.  Call your local or state health department.  Contact the Centers for Disease Control and Prevention (CDC): ? Call 340-835-1139 (1-800-CDC-INFO) or ? Visit CDC's BiotechRoom.com.cy Vaccine Information Statement (Interim) Inactivated Influenza Vaccine (10/30/2017) This information is not intended to replace advice given to you by your health care provider. Make sure you discuss any questions you have with your health care provider. Document Revised: 06/23/2018 Document Reviewed: 11/03/2017 Elsevier Patient Education  2020 [employer].

## 2020-03-16 LAB — BAYER DCA HB A1C WAIVED: HB A1C (BAYER DCA - WAIVED): 5.4 % (ref <7.0)

## 2020-03-16 LAB — LIPID PANEL
Chol/HDL Ratio: 4.5 ratio — ABNORMAL HIGH (ref 0.0–4.4)
Cholesterol, Total: 198 mg/dL (ref 100–199)
HDL: 44 mg/dL (ref >39)
LDL Chol Calc (NIH): 122 mg/dL — ABNORMAL HIGH (ref 0–99)
Triglycerides: 180 mg/dL — ABNORMAL HIGH (ref 0–149)
VLDL Cholesterol Cal: 32 mg/dL (ref 5–40)

## 2020-03-16 LAB — CBC
Hematocrit: 37 % (ref 34.0–46.6)
Hemoglobin: 11.7 g/dL (ref 11.1–15.9)
MCH: 24.4 pg — ABNORMAL LOW (ref 26.6–33.0)
MCHC: 31.6 g/dL (ref 31.5–35.7)
MCV: 77 fL — ABNORMAL LOW (ref 79–97)
Platelets: 267 10*3/uL (ref 150–450)
RBC: 4.79 x10E6/uL (ref 3.77–5.28)
RDW: 13.8 % (ref 11.7–15.4)
WBC: 7.6 10*3/uL (ref 3.4–10.8)
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
## 2020-04-11 ENCOUNTER — Ambulatory Visit: Payer: Managed Care, Other (non HMO) | Admitting: Internal Medicine

## 2020-04-11 ENCOUNTER — Other Ambulatory Visit: Payer: Self-pay

## 2020-04-11 ENCOUNTER — Encounter: Payer: Self-pay | Admitting: Internal Medicine

## 2020-04-11 VITALS — BP 118/90 | HR 79 | Ht 62.0 in | Wt 185.0 lb

## 2020-04-11 DIAGNOSIS — E7800 Pure hypercholesterolemia, unspecified: Secondary | ICD-10-CM

## 2020-04-11 DIAGNOSIS — R000 Tachycardia, unspecified: Secondary | ICD-10-CM

## 2020-04-11 NOTE — Patient Instructions (Signed)
Medication Instructions:  No Changes In Medications at this time.  *If you need a refill on your cardiac medications before your next appointment, please call your pharmacy*  Follow-Up: At [facility], you and your health needs are our priority.  As part of our continuing mission to provide you with exceptional heart care, we have created designated Provider Care Teams.  These Care Teams include your primary Cardiologist (physician) and Advanced Practice Providers (APPs -  Physician Assistants and Nurse Practitioners) who all work together to provide you with the care you need, when you need it.  Your next appointment:   3 month(s)  The format for your next appointment:   In Person  Provider:   Gayatri Acharya, MD  

## 2020-04-21 ENCOUNTER — Other Ambulatory Visit: Payer: Self-pay

## 2020-04-21 ENCOUNTER — Ambulatory Visit: Payer: Managed Care, Other (non HMO) | Admitting: Nurse Practitioner

## 2020-04-21 ENCOUNTER — Encounter: Payer: Self-pay | Admitting: Nurse Practitioner

## 2020-04-21 DIAGNOSIS — K1370 Unspecified lesions of oral mucosa: Secondary | ICD-10-CM

## 2020-04-21 MED ORDER — NYSTATIN 100000 UNIT/ML MT SUSP: 5.0000 mL | Freq: Four times a day (QID) | OROMUCOSAL | 0 refills | Status: DC

## 2020-04-21 MED ORDER — LIDOCAINE VISCOUS HCL 2 % MT SOLN
15.0000 mL | OROMUCOSAL | 0 refills | Status: DC | PRN
Start: 2020-04-21 — End: 2020-09-04

## 2020-04-21 NOTE — Patient Instructions (Signed)
Oral Ulcers Oral ulcers are small sores inside the mouth or near the mouth. They may occur on or inside the lips, inside the cheeks, on the tongue, or anywhere else inside or near the mouth. They may be called canker sores or cold sores, which are two types of oral ulcers. Many oral ulcers are harmless and go away on their own. In some cases, oral ulcers may require medical care to determine the cause and proper treatment. What are the causes? Common causes of this condition include: Infections caused by viruses, bacteria, or fungi. Emotional stress. Foods or chemicals that irritate the mouth. Injury or physical irritation of the mouth. Medicines. Allergies. Tobacco use. Less common causes include: Skin disease. A type of herpes virus infection (herpes simplex or herpes zoster). Oral cancer. In some cases, the cause may not be known. What increases the risk? You are more likely to develop this condition if: You wear dental braces, dentures, or retainers. You have poor oral hygiene. You have sensitive skin. You have a condition that affects the entire body (systemic condition), such as an immune disorder. What are the signs or symptoms? The main symptom of this condition is having one or more oval-shaped or round ulcers that have red borders. Symptoms may vary depending on the cause. This includes: Location of the ulcers. Ulcers may be found inside the mouth, on the gums, or on the insides of the lips or cheeks. They may also be found on the lips or on skin that is near the mouth, such as the cheeks or chin. Pain. Ulcers can be painful and uncomfortable, or they can be painless. Appearance of the ulcers. They may look like red blisters and be filled with fluid, or they may be white or yellow patches. Frequency of outbreaks. Ulcers may go away permanently after one outbreak, or they may come back (recur) often or rarely.   How is this diagnosed? This condition is diagnosed with a physical  exam. Your health care provider may ask you questions about your lifestyle and your medical history. You may have tests, including: Blood tests. Removal of a small number of cells from an ulcer to be examined under a microscope (biopsy). How is this treated? Treatment depends on the severity and cause of the condition. Oral ulcers often go away on their own in 1-2 weeks. Treatment may include medicines, such as: Medicines to treat a viral infection (antivirals), a bacterial infection (antibiotics), or a fungal infection (antifungals). Medicines to help control pain. This may include: Over-the-counter pain medicines. Gel, cream, or spray to numb the area (topical anesthetic) if you have severe pain. Other medicines to coat or numb your mouth. Follow these instructions at home: Medicines Take or use over-the-counter and prescription medicines only as told by your health care provider. If you were prescribed an antibiotic medicine, take it as told by your health care provider. Do not stop taking the antibiotic even if you start to feel better. Do not use products that contain benzocaine (including numbing gels) to treat teething or mouth pain in children who are younger than 2 years. These products may cause a rare but serious blood condition. Eating and drinking Eat a balanced diet. Do not eat: Spicy foods. Citrus, such as oranges. Other foods and drinks that you think may cause or irritate your ulcers. Drink enough fluid to keep your urine pale yellow. Avoid drinking alcohol. Lifestyle Practice good oral hygiene: Gently brush your teeth with a soft toothbrush two times a day.   Floss your teeth every day. Get regular dental cleanings and checkups. Do not use any products that contain nicotine or tobacco, such as cigarettes and e-cigarettes. If you need help quitting, ask your health care provider.   Managing pain If directed, put ice on your face in the affected area to help reduce  pain. Put ice in a plastic bag. Place a towel between your skin and the bag. Leave the ice on for 20 minutes, 2-3 times a day. Avoid physical or chemical irritants that may have caused the ulcers or made them worse, such as mouthwashes that contain alcohol (ethanol). If you wear dental braces, dentures, or retainers, work with your health care provider to make sure these devices are fitted correctly. If you were prescribed a prescription mouthwash to help reduce pain in your mouth, use it as told by your health care provider. General instructions Rinse with a salt-water mixture 3-4 times a day or as told by your health care provider. To make a salt-water mixture, completely dissolve -1 tsp (3-6 g) of salt in 1 cup (237 mL) of warm water. Keep all follow-up visits as told by your health care provider. This is important. Contact a health care provider if: You have: Pain that gets worse or does not get better with medicine. Four or more ulcers at one time. A fever. New ulcers that look or feel different from other ulcers you have. Inflammation in one eye or both eyes. Ulcers that do not go away after 10 days. You develop new symptoms in your mouth, such as: Bleeding or crusting around your lips or gums. Tooth pain. Difficulty swallowing. You develop symptoms on your skin or genitals, such as: A rash or blisters. Burning or itching sensations. Your ulcers begin or get worse after you start a new medicine. Get help right away if you have: Difficulty breathing. Swelling in your face or neck. Excessive bleeding from your mouth. Severe pain. Summary Oral ulcers may occur anywhere inside or near the mouth. They can be caused by many things, such as infections, stress, injury or irritation, or tobacco use. Oral ulcers can be painful or painless. Treatment may include medicines to relieve pain or to treat an infection (if appropriate). Most oral ulcers go away in 1-2 weeks. This information  is not intended to replace advice given to you by your health care provider. Make sure you discuss any questions you have with your health care provider. Document Revised: 07/17/2017 Document Reviewed: 07/17/2017 Elsevier Patient Education  2021 Elsevier Inc.  

## 2020-04-21 NOTE — Assessment & Plan Note (Signed)
Acute x 2 episodes.  ?related to recent medications or food.  Slight white film to tongue noted.  Will send in viscous lidocaine to use as needed for any discomfort and Nystatin swish and swallow for possible yeast to tongue area.  Recommend to document any further episodes and any new medications or foods present with these.  No labs today, but if ongoing consider HSV labs and possible allergy testing.  Return to office for worsening or ongoing.

## 2020-04-21 NOTE — Progress Notes (Signed)
BP 118/83   Pulse 79   Temp 98 F (36.7 C) (Oral)   Ht 5' 2.01" (1.575 m)   Wt 184 lb (83.5 kg)   SpO2 99%   BMI 33.65 kg/m    Subjective:    Patient ID: Tina Francis, female    DOB: Oct 12, 1985, 35 y.o.   MRN: [id number]  HPI: Tina Francis is a 35 y.o. female  Chief Complaint  Patient presents with  . Mouth Lesions    Present the day before yesterday, under tongue   MOUTH SORES Presents today for sores in mouth.  Had one last week and then went away, then this one started showing up 2 days ago.  Reports when they pop they hurt -- get a blister to area.  Took Tylenol last week and ASA last week -- no other new medications.  Did eat cashews recently.  No known allergies. Duration: days Location: under the tongue History of trauma in area: no Pain: yes when pop Quality: no Severity: mild Redness: no Swelling: no Oozing: no Pus: no Fevers: no Nausea/vomiting: no Status: fluctuating Treatments attempted:none    Relevant past medical, surgical, family and social history reviewed and updated as indicated. Interim medical history since our last visit reviewed. Allergies and medications reviewed and updated.  Review of Systems  Constitutional: Negative for activity change, appetite change, diaphoresis, fatigue and fever.  HENT: Positive for mouth sores.   Respiratory: Negative for cough, chest tightness and shortness of breath.   Cardiovascular: Negative for chest pain, palpitations and leg swelling.  Gastrointestinal: Negative.   Neurological: Negative.   Psychiatric/Behavioral: Negative.     Per HPI unless specifically indicated above     Objective:    BP 118/83   Pulse 79   Temp 98 F (36.7 C) (Oral)   Ht 5' 2.01" (1.575 m)   Wt 184 lb (83.5 kg)   SpO2 99%   BMI 33.65 kg/m   Wt Readings from Last 3 Encounters:  04/21/20 184 lb (83.5 kg)  04/11/20 185 lb (83.9 kg)  03/15/20 182 lb 9.6 oz (82.8 kg)    Physical Exam Vitals and nursing note  reviewed.  Constitutional:      General: She is awake. She is not in acute distress.    Appearance: She is well-developed and well-groomed. She is obese. She is not ill-appearing.  HENT:     Head: Normocephalic.     Right Ear: Hearing normal.     Left Ear: Hearing normal.     Nose: Nose normal.     Mouth/Throat:     Mouth: Mucous membranes are moist. No oral lesions (none external) or angioedema.     Tongue: No lesions.     Pharynx: Oropharynx is clear.     Comments: Small whitish ulceration under right side tongue, with mild erythema.  No blister.  Slight white film to tongue noted.  No other oral lesions noted either internal or external on exam. Eyes:     General: Lids are normal.        Right eye: No discharge.        Left eye: No discharge.     Conjunctiva/sclera: Conjunctivae normal.     Pupils: Pupils are equal, round, and reactive to light.  Neck:     Thyroid: No thyromegaly.     Vascular: No carotid bruit or JVD.  Cardiovascular:     Rate and Rhythm: Normal rate and regular rhythm.     Heart sounds: Normal heart  sounds. No murmur heard. No gallop.   Pulmonary:     Effort: Pulmonary effort is normal. No accessory muscle usage or respiratory distress.     Breath sounds: Normal breath sounds.  Abdominal:     General: Bowel sounds are normal.     Palpations: Abdomen is soft.  Musculoskeletal:     Cervical back: Normal range of motion and neck supple.     Right lower leg: No edema.     Left lower leg: No edema.  Lymphadenopathy:     Cervical: No cervical adenopathy.  Skin:    General: Skin is warm and dry.  Neurological:     Mental Status: She is alert and oriented to person, place, and time.  Psychiatric:        Attention and Perception: Attention normal.        Mood and Affect: Mood normal.        Speech: Speech normal.        Behavior: Behavior normal. Behavior is cooperative.        Thought Content: Thought content normal.    Results for orders placed or  performed in visit on 03/15/20  CBC  Result Value Ref Range   WBC 7.6 3.4 - 10.8 x10E3/uL   RBC 4.79 3.77 - 5.28 x10E6/uL   Hemoglobin 11.7 11.1 - 15.9 g/dL   Hematocrit 76.5 46.5 - 46.6 %   MCV 77 (L) 79 - 97 fL   MCH 24.4 (L) 26.6 - 33.0 pg   MCHC 31.6 31.5 - 35.7 g/dL   RDW 03.5 46.5 - 68.1 %   Platelets 267 150 - 450 x10E3/uL  Lipid panel  Result Value Ref Range   Cholesterol, Total 198 100 - 199 mg/dL   Triglycerides 275 (H) 0 - 149 mg/dL   HDL 44 >17 mg/dL   VLDL Cholesterol Cal 32 5 - 40 mg/dL   LDL Chol Calc (NIH) 001 (H) 0 - 99 mg/dL   Chol/HDL Ratio 4.5 (H) 0.0 - 4.4 ratio  Bayer DCA Hb A1c Waived (STAT)  Result Value Ref Range   HB A1C (BAYER DCA - WAIVED) 5.4 <7.0 %      Assessment & Plan:   Problem List Items Addressed This Visit      Other   Oral lesion    Acute x 2 episodes.  ?related to recent medications or food.  Slight white film to tongue noted.  Will send in viscous lidocaine to use as needed for any discomfort and Nystatin swish and swallow for possible yeast to tongue area.  Recommend to document any further episodes and any new medications or foods present with these.  No labs today, but if ongoing consider HSV labs and possible allergy testing.  Return to office for worsening or ongoing.            Follow up plan: Return if symptoms worsen or fail to improve.

## 2020-07-11 ENCOUNTER — Ambulatory Visit: Payer: Managed Care, Other (non HMO) | Admitting: Internal Medicine

## 2020-09-04 ENCOUNTER — Other Ambulatory Visit: Payer: Self-pay

## 2020-09-04 ENCOUNTER — Ambulatory Visit: Payer: Managed Care, Other (non HMO) | Admitting: Nurse Practitioner

## 2020-09-04 ENCOUNTER — Encounter: Payer: Self-pay | Admitting: Nurse Practitioner

## 2020-09-04 VITALS — BP 130/88 | HR 71 | Temp 98.3°F | Wt 182.0 lb

## 2020-09-04 DIAGNOSIS — N644 Mastodynia: Secondary | ICD-10-CM | POA: Diagnosis not present
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
## 2020-09-08 ENCOUNTER — Other Ambulatory Visit: Payer: Self-pay

## 2020-09-08 ENCOUNTER — Telehealth: Payer: Self-pay

## 2020-09-08 DIAGNOSIS — N644 Mastodynia: Secondary | ICD-10-CM

## 2020-09-08 NOTE — Telephone Encounter (Signed)
Corrected orders in chart and confirmed with [place] that they were correct.  Called and notified patient that orders were corrected for her and that she should be able to schedule now.

## 2020-09-08 NOTE — Telephone Encounter (Signed)
Copied from CRM [id number]. Topic: General - Other >> Sep 08, 2020 10:33 AM Eliseo Gum, Deedra Ehrich wrote: Reason for CRM: ptient called in aboout trying to schedule appt at [facility] [place] office and she states that  She was told by that office(didn't give name of mammogram office) that, bilateral left breast ultrasound is needed of left breast and right breast. Please call back about this

## 2020-09-21 ENCOUNTER — Ambulatory Visit
Admission: RE | Admit: 2020-09-21 | Discharge: 2020-09-21 | Disposition: A | Discharge status: Home / Self Care | Payer: Managed Care, Other (non HMO) | Source: Ambulatory Visit | Attending: Nurse Practitioner | Admitting: Nurse Practitioner

## 2020-09-21 ENCOUNTER — Other Ambulatory Visit: Payer: Self-pay

## 2020-09-21 DIAGNOSIS — N644 Mastodynia: Secondary | ICD-10-CM

## 2020-09-22 NOTE — Progress Notes (Signed)
Please let patient know her Mammogram did not show any evidence of a malignancy.  The recommendation is to repeat the Mammogram in 1 year.  

## 2020-09-22 NOTE — Progress Notes (Signed)
Please let patient know her ultrasound did not show any evidence of a malignancy.

## 2021-01-22 ENCOUNTER — Other Ambulatory Visit: Payer: Self-pay

## 2021-01-22 ENCOUNTER — Ambulatory Visit: Payer: Managed Care, Other (non HMO) | Admitting: Nurse Practitioner

## 2021-01-22 ENCOUNTER — Encounter: Payer: Self-pay | Admitting: Nurse Practitioner

## 2021-01-22 VITALS — BP 123/80 | HR 87 | Temp 97.3°F | Ht 62.0 in | Wt 178.2 lb

## 2021-01-22 DIAGNOSIS — Z23 Encounter for immunization: Secondary | ICD-10-CM | POA: Diagnosis not present

## 2021-01-22 DIAGNOSIS — Z309 Encounter for contraceptive management, unspecified: Secondary | ICD-10-CM

## 2021-01-22 NOTE — Progress Notes (Signed)
BP 123/80   Pulse 87   Temp (!) 97.3 F (36.3 C) (Oral)   Ht 5\' 2"  (1.575 m)   Wt 178 lb 3.2 oz (80.8 kg)   LMP 01/20/2021 (Exact Date)   SpO2 99%   BMI 32.59 kg/m    Subjective:    Patient ID: [id number], female    DOB: May 26, 1985, 35 y.o.   MRN: [id number]  HPI: Tina Francis is a 35 y.o. female  Chief Complaint  Patient presents with   Contraception    Patient states she has a trip coming up and would like to change the dates of her period for her travel. Patient states she has never tried birth control.  She will be gone for 3 weeks.     Denies HA, CP, SOB, dizziness, palpitations, visual changes, and lower extremity swelling.  Relevant past medical, surgical, family and social history reviewed and updated as indicated. Interim medical history since our last visit reviewed. Allergies and medications reviewed and updated.  Review of Systems  Eyes:  Negative for visual disturbance.  Respiratory:  Negative for cough, chest tightness and shortness of breath.   Cardiovascular:  Negative for chest pain, palpitations and leg swelling.  Neurological:  Negative for dizziness and headaches.   Per HPI unless specifically indicated above     Objective:    BP 123/80   Pulse 87   Temp (!) 97.3 F (36.3 C) (Oral)   Ht 5\' 2"  (1.575 m)   Wt 178 lb 3.2 oz (80.8 kg)   LMP 01/20/2021 (Exact Date)   SpO2 99%   BMI 32.59 kg/m   Wt Readings from Last 3 Encounters:  01/22/21 178 lb 3.2 oz (80.8 kg)  09/04/20 182 lb (82.6 kg)  04/21/20 184 lb (83.5 kg)    Physical Exam Vitals and nursing note reviewed.  Constitutional:      General: She is not in acute distress.    Appearance: Normal appearance. She is normal weight. She is not ill-appearing, toxic-appearing or diaphoretic.  HENT:     Head: Normocephalic.     Right Ear: External ear normal.     Left Ear: External ear normal.     Nose: Nose normal.     Mouth/Throat:     Mouth: Mucous membranes are moist.      Pharynx: Oropharynx is clear.  Eyes:     General:        Right eye: No discharge.        Left eye: No discharge.     Extraocular Movements: Extraocular movements intact.     Conjunctiva/sclera: Conjunctivae normal.     Pupils: Pupils are equal, round, and reactive to light.  Cardiovascular:     Rate and Rhythm: Normal rate and regular rhythm.     Heart sounds: No murmur heard. Pulmonary:     Effort: Pulmonary effort is normal. No respiratory distress.     Breath sounds: Normal breath sounds. No wheezing or rales.  Musculoskeletal:     Cervical back: Normal range of motion and neck supple.  Skin:    General: Skin is warm and dry.     Capillary Refill: Capillary refill takes less than 2 seconds.  Neurological:     General: No focal deficit present.     Mental Status: She is alert and oriented to person, place, and time. Mental status is at baseline.  Psychiatric:        Mood and Affect: Mood normal.  Behavior: Behavior normal.        Thought Content: Thought content normal.        Judgment: Judgment normal.    Results for orders placed or performed in visit on 03/15/20  CBC  Result Value Ref Range   WBC 7.6 3.4 - 10.8 x10E3/uL   RBC 4.79 3.77 - 5.28 x10E6/uL   Hemoglobin 11.7 11.1 - 15.9 g/dL   Hematocrit 37.0 34.0 - 46.6 %   MCV 77 (L) 79 - 97 fL   MCH 24.4 (L) 26.6 - 33.0 pg   MCHC 31.6 31.5 - 35.7 g/dL   RDW 13.8 11.7 - 15.4 %   Platelets 267 150 - 450 x10E3/uL  Lipid panel  Result Value Ref Range   Cholesterol, Total 198 100 - 199 mg/dL   Triglycerides 180 (H) 0 - 149 mg/dL   HDL 44 >39 mg/dL   VLDL Cholesterol Cal 32 5 - 40 mg/dL   LDL Chol Calc (NIH) 122 (H) 0 - 99 mg/dL   Chol/HDL Ratio 4.5 (H) 0.0 - 4.4 ratio  Bayer DCA Hb A1c Waived (STAT)  Result Value Ref Range   HB A1C (BAYER DCA - WAIVED) 5.4 <7.0 %      Assessment & Plan:   Problem List Items Addressed This Visit   None Visit Diagnoses     Encounter for contraceptive management,  unspecified type    -  Primary   Discussed with patient the side effects of starting birth control when she is about to travel. Risk of nausea, break through bleeding and blood clot.    Need for influenza vaccination       Relevant Orders   Flu Vaccine QUAD 80mo+IM (Fluarix, Fluzone & Alfiuria Quad PF) (Completed)        Follow up plan: Return if symptoms worsen or fail to improve.

## 2021-03-14 ENCOUNTER — Encounter: Payer: Self-pay | Admitting: Nurse Practitioner

## 2021-03-14 ENCOUNTER — Ambulatory Visit (INDEPENDENT_AMBULATORY_CARE_PROVIDER_SITE_OTHER): Payer: Managed Care, Other (non HMO) | Admitting: Nurse Practitioner

## 2021-03-14 ENCOUNTER — Other Ambulatory Visit: Payer: Self-pay

## 2021-03-14 VITALS — BP 123/80 | HR 82 | Temp 98.7°F | Ht 62.0 in | Wt 173.0 lb

## 2021-03-14 DIAGNOSIS — Z0000 Encounter for general adult medical examination without abnormal findings: Secondary | ICD-10-CM | POA: Diagnosis not present

## 2021-03-14 DIAGNOSIS — R8290 Unspecified abnormal findings in urine: Secondary | ICD-10-CM | POA: Diagnosis not present

## 2021-03-14 DIAGNOSIS — L299 Pruritus, unspecified: Secondary | ICD-10-CM

## 2021-03-14 DIAGNOSIS — Z789 Other specified health status: Secondary | ICD-10-CM | POA: Diagnosis not present

## 2021-03-14 DIAGNOSIS — N898 Other specified noninflammatory disorders of vagina: Secondary | ICD-10-CM | POA: Diagnosis not present

## 2021-03-14 DIAGNOSIS — Z1159 Encounter for screening for other viral diseases: Secondary | ICD-10-CM

## 2021-03-14 LAB — URINALYSIS, ROUTINE W REFLEX MICROSCOPIC
Bilirubin, UA: NEGATIVE
Glucose, UA: NEGATIVE
Ketones, UA: NEGATIVE
Nitrite, UA: NEGATIVE
Protein,UA: NEGATIVE
Specific Gravity, UA: 1.01 (ref 1.005–1.030)
Urobilinogen, Ur: 0.2 mg/dL (ref 0.2–1.0)
pH, UA: 5.5 (ref 5.0–7.5)

## 2021-03-14 LAB — MICROSCOPIC EXAMINATION

## 2021-03-14 LAB — WET PREP FOR TRICH, YEAST, CLUE
Clue Cell Exam: POSITIVE — AB
Trichomonas Exam: NEGATIVE
Yeast Exam: NEGATIVE

## 2021-03-14 LAB — PREGNANCY, URINE: Preg Test, Ur: NEGATIVE

## 2021-03-14 MED ORDER — TRIAMCINOLONE ACETONIDE 0.025 % EX OINT
1.0000 | TOPICAL_OINTMENT | Freq: Two times a day (BID) | CUTANEOUS | 0 refills | Status: DC
Start: 2021-03-14 — End: 2021-06-06

## 2021-03-14 MED ORDER — METRONIDAZOLE 500 MG PO TABS: 500.0000 mg | ORAL_TABLET | Freq: Two times a day (BID) | ORAL | 0 refills | Status: AC

## 2021-03-14 NOTE — Progress Notes (Signed)
BP 123/80    Pulse 82    Temp 98.7 F (37.1 C) (Oral)    Ht 5\' 2"  (1.575 m)    Wt 173 lb (78.5 kg)    LMP 02/17/2021 (Exact Date)    SpO2 98%    BMI 31.64 kg/m    Subjective:    Patient ID: Tina Francis, female    DOB: [birth year], 35 y.o.   MRN: [id number]  HPI: Tina Francis is a 35 y.o. female presenting on 03/14/2021 for comprehensive medical examination. Current medical complaints include: vaginal itching  Patient states she is having vaginal itching.  Believes she has a yeast infection.     Patient states she is also having some itching on her breast.  Had a mammogram done and it did not show anything concerning.    She currently lives with: Menopausal Symptoms: no  Depression Screen done today and results listed below:  Depression screen [facility] 2/9 03/14/2021 04/21/2020 03/15/2020 03/16/2018 03/16/2018  Decreased Interest 0 0 0 0 0  Down, Depressed, Hopeless 0 0 0 0 0  PHQ - 2 Score 0 0 0 0 0  Altered sleeping 1 - - 1 1  Tired, decreased energy 0 - - 0 0  Change in appetite 0 - - 0 0  Feeling bad or failure about yourself  0 - - 0 0  Trouble concentrating 0 - - 0 0  Moving slowly or fidgety/restless 0 - - 0 0  Suicidal thoughts 0 - - 0 0  PHQ-9 Score 1 - - 1 1  Difficult doing work/chores Not difficult at all - - - Not difficult at all    The patient does not have a history of falls. I did complete a risk assessment for falls. A plan of care for falls was documented.   Past Medical History:  Past Medical History:  Diagnosis Date   Infertility    Vacuum extractor delivery, delivered 04/29/2013    Surgical History:  Past Surgical History:  Procedure Laterality Date   NO PAST SURGERIES      Medications:  No current outpatient medications on file prior to visit.   No current facility-administered medications on file prior to visit.    Allergies:  No Known Allergies  Social History:  Social History   Socioeconomic History   Marital status: Married     Spouse name: Not on file   Number of children: Not on file   Years of education: Not on file   Highest education level: Not on file  Occupational History   Not on file  Tobacco Use   Smoking status: Never   Smokeless tobacco: Never  Vaping Use   Vaping Use: Never used  Substance and Sexual Activity   Alcohol use: No   Drug use: No   Sexual activity: Yes  Other Topics Concern   Not on file  Social History Narrative   Not on file   Social Determinants of Health   Financial Resource Strain: Not on file  Food Insecurity: Not on file  Transportation Needs: Not on file  Physical Activity: Not on file  Stress: Not on file  Social Connections: Not on file  Intimate Partner Violence: Not on file   Social History   Tobacco Use  Smoking Status Never  Smokeless Tobacco Never   Social History   Substance and Sexual Activity  Alcohol Use No    Family History:  Family History  Problem Relation Age of Onset   Heart attack  Mother    Hypertension Mother    Diabetes Maternal Grandmother    Heart attack Father    Autism spectrum disorder Son     Past medical history, surgical history, medications, allergies, family history and social history reviewed with patient today and changes made to appropriate areas of the chart.   Review of Systems  Eyes:  Negative for blurred vision and double vision.  Respiratory:  Negative for shortness of breath.   Cardiovascular:  Negative for chest pain, palpitations and leg swelling.  Genitourinary:        Vaginal itching  Skin:  Positive for itching.  Neurological:  Negative for dizziness and headaches.  All other ROS negative except what is listed above and in the HPI.      Objective:    BP 123/80    Pulse 82    Temp 98.7 F (37.1 C) (Oral)    Ht 5\' 2"  (1.575 m)    Wt 173 lb (78.5 kg)    LMP 02/17/2021 (Exact Date)    SpO2 98%    BMI 31.64 kg/m   Wt Readings from Last 3 Encounters:  03/14/21 173 lb (78.5 kg)  01/22/21 178 lb  3.2 oz (80.8 kg)  09/04/20 182 lb (82.6 kg)    Physical Exam Vitals and nursing note reviewed.  Constitutional:      General: She is awake. She is not in acute distress.    Appearance: She is well-developed. She is not ill-appearing.  HENT:     Head: Normocephalic and atraumatic.     Right Ear: Hearing, tympanic membrane, ear canal and external ear normal. No drainage.     Left Ear: Hearing, tympanic membrane, ear canal and external ear normal. No drainage.     Nose: Nose normal.     Right Sinus: No maxillary sinus tenderness or frontal sinus tenderness.     Left Sinus: No maxillary sinus tenderness or frontal sinus tenderness.     Mouth/Throat:     Mouth: Mucous membranes are moist.     Pharynx: Oropharynx is clear. Uvula midline. No pharyngeal swelling, oropharyngeal exudate or posterior oropharyngeal erythema.  Eyes:     General: Lids are normal.        Right eye: No discharge.        Left eye: No discharge.     Extraocular Movements: Extraocular movements intact.     Conjunctiva/sclera: Conjunctivae normal.     Pupils: Pupils are equal, round, and reactive to light.     Visual Fields: Right eye visual fields normal and left eye visual fields normal.  Neck:     Thyroid: No thyromegaly.     Vascular: No carotid bruit.     Trachea: Trachea normal.  Cardiovascular:     Rate and Rhythm: Normal rate and regular rhythm.     Heart sounds: Normal heart sounds. No murmur heard.   No gallop.  Pulmonary:     Effort: Pulmonary effort is normal. No accessory muscle usage or respiratory distress.     Breath sounds: Normal breath sounds.  Chest:  Breasts:    Right: Normal.     Left: Normal.  Abdominal:     General: Bowel sounds are normal.     Palpations: Abdomen is soft. There is no hepatomegaly or splenomegaly.     Tenderness: There is no abdominal tenderness.  Musculoskeletal:        General: Normal range of motion.     Cervical back: Normal range of motion and  neck supple.      Right lower leg: No edema.     Left lower leg: No edema.  Lymphadenopathy:     Head:     Right side of head: No submental, submandibular, tonsillar, preauricular or posterior auricular adenopathy.     Left side of head: No submental, submandibular, tonsillar, preauricular or posterior auricular adenopathy.     Cervical: No cervical adenopathy.     Upper Body:     Right upper body: No supraclavicular, axillary or pectoral adenopathy.     Left upper body: No supraclavicular, axillary or pectoral adenopathy.  Skin:    General: Skin is warm and dry.     Capillary Refill: Capillary refill takes less than 2 seconds.     Findings: No rash.       Neurological:     Mental Status: She is alert and oriented to person, place, and time.     Gait: Gait is intact.     Deep Tendon Reflexes: Reflexes are normal and symmetric.     Reflex Scores:      Brachioradialis reflexes are 2+ on the right side and 2+ on the left side.      Patellar reflexes are 2+ on the right side and 2+ on the left side. Psychiatric:        Attention and Perception: Attention normal.        Mood and Affect: Mood normal.        Speech: Speech normal.        Behavior: Behavior normal. Behavior is cooperative.        Thought Content: Thought content normal.        Judgment: Judgment normal.    Results for orders placed or performed in visit on 03/15/20  CBC  Result Value Ref Range   WBC 7.6 3.4 - 10.8 x10E3/uL   RBC 4.79 3.77 - 5.28 x10E6/uL   Hemoglobin 11.7 11.1 - 15.9 g/dL   Hematocrit 37.0 34.0 - 46.6 %   MCV 77 (L) 79 - 97 fL   MCH 24.4 (L) 26.6 - 33.0 pg   MCHC 31.6 31.5 - 35.7 g/dL   RDW 13.8 11.7 - 15.4 %   Platelets 267 150 - 450 x10E3/uL  Lipid panel  Result Value Ref Range   Cholesterol, Total 198 100 - 199 mg/dL   Triglycerides 180 (H) 0 - 149 mg/dL   HDL 44 >39 mg/dL   VLDL Cholesterol Cal 32 5 - 40 mg/dL   LDL Chol Calc (NIH) 122 (H) 0 - 99 mg/dL   Chol/HDL Ratio 4.5 (H) 0.0 - 4.4 ratio  Bayer DCA  Hb A1c Waived (STAT)  Result Value Ref Range   HB A1C (BAYER DCA - WAIVED) 5.4 <7.0 %      Assessment & Plan:   Problem List Items Addressed This Visit   None Visit Diagnoses     Annual physical exam    -  Primary   Health maintenance reviewed during visit. Labs ordered today. Up to date on vaccines.   Relevant Orders   CBC with Differential/Platelet   Comprehensive metabolic panel   Lipid panel   TSH   Urinalysis, Routine w reflex microscopic   Vaginal itching       Wet prep positive for clue cells. Will treat with flagyl. Follow up if symptoms do not improve.   Relevant Orders   WET PREP FOR [place], YEAST, CLUE   Attempting to conceive       Patient  requested pregnancy test in office. Only been trying for a couple of months.   Relevant Orders   Pregnancy, urine   Itching       Appears to be eczema on breast area. Discussed how to properly use triamcinalone. Return to clinic if symptoms improve.   Encounter for hepatitis C screening test for low risk patient       Relevant Orders   Hepatitis C Antibody        Follow up plan: Return in about 1 year (around 03/14/2022) for Physical and Fasting labs.   LABORATORY TESTING:  - Pap smear: up to date  IMMUNIZATIONS:   - Tdap: Tetanus vaccination status reviewed: last tetanus booster within 10 years. - Influenza: Up to date - Pneumovax: Not applicable - Prevnar: Not applicable - COVID: Up to date - HPV:  Discussed at visit today. In Monogamous relationship. - Shingrix vaccine: Not applicable  SCREENING: -Mammogram: Not applicable  - Colonoscopy: Not applicable  - Bone Density: Not applicable  -Hearing Test: Not applicable  -Spirometry: Not applicable   PATIENT COUNSELING:   Advised to take 1 mg of folate supplement per day if capable of pregnancy.   Sexuality: Discussed sexually transmitted diseases, partner selection, use of condoms, avoidance of unintended pregnancy  and contraceptive alternatives.   Advised  to avoid cigarette smoking.  I discussed with the patient that most people either abstain from alcohol or drink within safe limits (<=14/week and <=4 drinks/occasion for males, <=7/weeks and <= 3 drinks/occasion for females) and that the risk for alcohol disorders and other health effects rises proportionally with the number of drinks per week and how often a drinker exceeds daily limits.  Discussed cessation/primary prevention of drug use and availability of treatment for abuse.   Diet: Encouraged to adjust caloric intake to maintain  or achieve ideal body weight, to reduce intake of dietary saturated fat and total fat, to limit sodium intake by avoiding high sodium foods and not adding table salt, and to maintain adequate dietary potassium and calcium preferably from fresh fruits, vegetables, and low-fat dairy products.    stressed the importance of regular exercise  Injury prevention: Discussed safety belts, safety helmets, smoke detector, smoking near bedding or upholstery.   Dental health: Discussed importance of regular tooth brushing, flossing, and dental visits.    NEXT PREVENTATIVE PHYSICAL DUE IN 1 YEAR. Return in about 1 year (around 03/14/2022) for Physical and Fasting labs.

## 2021-03-14 NOTE — Progress Notes (Signed)
Hi Tina Francis.  Your pregnancy test was negative.

## 2021-03-14 NOTE — Progress Notes (Signed)
Results discussed with patient during visit. Will treat with flagyl. Will send urine for culture.

## 2021-03-15 ENCOUNTER — Encounter: Payer: Self-pay | Admitting: Nurse Practitioner

## 2021-03-15 LAB — COMPREHENSIVE METABOLIC PANEL
ALT: 9 IU/L (ref 0–32)
AST: 17 IU/L (ref 0–40)
Albumin/Globulin Ratio: 1.4 (ref 1.2–2.2)
Albumin: 4.5 g/dL (ref 3.8–4.8)
Alkaline Phosphatase: 86 IU/L (ref 44–121)
BUN/Creatinine Ratio: 10 (ref 9–23)
BUN: 7 mg/dL (ref 6–20)
Bilirubin Total: 0.3 mg/dL (ref 0.0–1.2)
CO2: 25 mmol/L (ref 20–29)
Calcium: 9.8 mg/dL (ref 8.7–10.2)
Chloride: 100 mmol/L (ref 96–106)
Creatinine, Ser: 0.73 mg/dL (ref 0.57–1.00)
Globulin, Total: 3.2 g/dL (ref 1.5–4.5)
Glucose: 92 mg/dL (ref 70–99)
Potassium: 4.2 mmol/L (ref 3.5–5.2)
Sodium: 137 mmol/L (ref 134–144)
Total Protein: 7.7 g/dL (ref 6.0–8.5)
eGFR: 111 mL/min/{1.73_m2} (ref >59)

## 2021-03-15 LAB — CBC WITH DIFFERENTIAL/PLATELET
Basophils Absolute: 0 10*3/uL (ref 0.0–0.2)
Basos: 0 %
EOS (ABSOLUTE): 0.1 10*3/uL (ref 0.0–0.4)
Eos: 1 %
Hematocrit: 39.5 % (ref 34.0–46.6)
Hemoglobin: 12.5 g/dL (ref 11.1–15.9)
Immature Grans (Abs): 0 10*3/uL (ref 0.0–0.1)
Immature Granulocytes: 0 %
Lymphocytes Absolute: 1.9 10*3/uL (ref 0.7–3.1)
Lymphs: 34 %
MCH: 24.8 pg — ABNORMAL LOW (ref 26.6–33.0)
MCHC: 31.6 g/dL (ref 31.5–35.7)
MCV: 78 fL — ABNORMAL LOW (ref 79–97)
Monocytes Absolute: 0.4 10*3/uL (ref 0.1–0.9)
Monocytes: 6 %
Neutrophils Absolute: 3.2 10*3/uL (ref 1.4–7.0)
Neutrophils: 59 %
Platelets: 246 10*3/uL (ref 150–450)
RBC: 5.04 x10E6/uL (ref 3.77–5.28)
RDW: 13.9 % (ref 11.7–15.4)
WBC: 5.6 10*3/uL (ref 3.4–10.8)

## 2021-03-15 LAB — LIPID PANEL
Chol/HDL Ratio: 5 ratio — ABNORMAL HIGH (ref 0.0–4.4)
Cholesterol, Total: 207 mg/dL — ABNORMAL HIGH (ref 100–199)
HDL: 41 mg/dL (ref >39)
LDL Chol Calc (NIH): 139 mg/dL — ABNORMAL HIGH (ref 0–99)
Triglycerides: 149 mg/dL (ref 0–149)
VLDL Cholesterol Cal: 27 mg/dL (ref 5–40)

## 2021-03-15 LAB — HEPATITIS C ANTIBODY: Hep C Virus Ab: 0.2 s/co ratio (ref 0.0–0.9)

## 2021-03-15 LAB — TSH: TSH: 1.97 u[IU]/mL (ref 0.450–4.500)

## 2021-03-15 NOTE — Progress Notes (Signed)
HI Tina Francis. It was good to see you yesterday. Your lab work looks good.  No concerns at this time. Your cholesterol is a little bit elevated.  I recommend you follow a low fat diet and exercise 5x weekly for at least 30 minutes. Continue with your current medication regimen.  Follow up as discussed.  Please let me know if you have any questions.

## 2021-03-16 ENCOUNTER — Encounter: Payer: Managed Care, Other (non HMO) | Admitting: Nurse Practitioner

## 2021-03-18 HISTORY — PX: WISDOM TOOTH EXTRACTION: SHX21

## 2021-03-19 LAB — URINE CULTURE

## 2021-03-20 NOTE — Progress Notes (Signed)
Hi Porshe. Your urine grew beta hemolytic Strep B. This is a normal bacteria and does not require treatment at this time.  If you become symptomatic please let me know and we will get you treated.

## 2021-04-27 ENCOUNTER — Encounter: Payer: Self-pay | Admitting: Nurse Practitioner

## 2021-04-27 DIAGNOSIS — R634 Abnormal weight loss: Secondary | ICD-10-CM

## 2021-05-01 ENCOUNTER — Telehealth: Payer: Self-pay

## 2021-05-01 NOTE — Telephone Encounter (Signed)
CALLED PATIENT NO ANSWER LEFT VOICEMAIL FOR A CALL BACK ? ?

## 2021-06-06 ENCOUNTER — Encounter: Payer: Self-pay | Admitting: Gastroenterology

## 2021-06-06 ENCOUNTER — Other Ambulatory Visit: Payer: Self-pay

## 2021-06-06 ENCOUNTER — Ambulatory Visit: Payer: Managed Care, Other (non HMO) | Admitting: Gastroenterology

## 2021-06-06 VITALS — BP 136/85 | HR 90 | Temp 98.1°F | Ht 63.0 in | Wt 171.0 lb

## 2021-06-06 DIAGNOSIS — R194 Change in bowel habit: Secondary | ICD-10-CM | POA: Diagnosis not present

## 2021-06-06 DIAGNOSIS — K5900 Constipation, unspecified: Secondary | ICD-10-CM

## 2021-06-06 DIAGNOSIS — R6881 Early satiety: Secondary | ICD-10-CM

## 2021-06-06 MED ORDER — NA SULFATE-K SULFATE-MG SULF 17.5-3.13-1.6 GM/177ML PO SOLN: 1.0000 | Freq: Once | ORAL | 0 refills | Status: AC

## 2021-06-06 NOTE — Progress Notes (Signed)
? ? ?Gastroenterology Consultation ? ?Referring Provider:     Jon Billings, NP ?Primary Care Physician:  Jon Billings, NP ?Primary Gastroenterologist:  Dr. Allen Norris     ?Reason for Consultation:     Weight loss ?      ? HPI:   ?Tina Francis is a 36 y.o. y/o female referred for consultation & management of weight loss by Dr. Jon Billings, NP.  This patient comes in today after contacting her primary care physician for continued weight loss and a decrease in appetite.  In June 2022 she was at her doctor's office and her weight was 182lbs with the following November being 178 and then 173 that December. She was 171 today. She has some constipation sometimes. She has had weight loss for a year. She has a family history of wheat allergy in a grandmother. She reports ribbon like stools.  The patient denies any black stools or bloody stools.  He denies any family history of colon cancer or colon polyps.  The patient also reports some early satiety.  Her appetite has not gone down and she states she is eating the same food she was eating before.  The husband reports that she does feel bloated in the upper abdomen whenever she does eat wheat products. ? ?Past Medical History:  ?Diagnosis Date  ? Infertility   ? Vacuum extractor delivery, delivered 04/29/2013  ? ? ?Past Surgical History:  ?Procedure Laterality Date  ? NO PAST SURGERIES    ? ? ?Prior to Admission medications   ?Medication Sig Start Date End Date Taking? Authorizing Provider  ?triamcinolone (KENALOG) 0.025 % ointment Apply 1 application topically 2 (two) times daily. 03/14/21   Jon Billings, NP  ? ? ?Family History  ?Problem Relation Age of Onset  ? Heart attack Mother   ? Hypertension Mother   ? Diabetes Maternal Grandmother   ? Heart attack Father   ? Autism spectrum disorder Son   ?  ? ?Social History  ? ?Tobacco Use  ? Smoking status: Never  ? Smokeless tobacco: Never  ?Vaping Use  ? Vaping Use: Never used  ?Substance Use Topics  ?  Alcohol use: No  ? Drug use: No  ? ? ?Allergies as of 06/06/2021  ? (No Known Allergies)  ? ? ?Review of Systems:    ?All systems reviewed and negative except where noted in HPI. ? ? Physical Exam:  ?There were no vitals taken for this visit. ?No LMP recorded. ?General:   Alert,  Well-developed, well-nourished, pleasant and cooperative in NAD ?Head:  Normocephalic and atraumatic. ?Eyes:  Sclera clear, no icterus.   Conjunctiva pink. ?Ears:  Normal auditory acuity. ?Neck:  Supple; no masses or thyromegaly. ?Lungs:  Respirations even and unlabored.  Clear throughout to auscultation.   No wheezes, crackles, or rhonchi. No acute distress. ?Heart:  Regular rate and rhythm; no murmurs, clicks, rubs, or gallops. ?Abdomen:  Normal bowel sounds.  No bruits.  Soft, non-tender and non-distended without masses, hepatosplenomegaly or hernias noted.  No guarding or rebound tenderness.  Negative Carnett sign.   ?Rectal:  Deferred.  ?Pulses:  Normal pulses noted. ?Extremities:  No clubbing or edema.  No cyanosis. ?Neurologic:  Alert and oriented x3;  grossly normal neurologically. ?Skin:  Intact without significant lesions or rashes.  No jaundice. ?Lymph Nodes:  No significant cervical adenopathy. ?Psych:  Alert and cooperative. Normal mood and affect. ? ?Imaging Studies: ?No results found. ? ?Assessment and Plan:  ? ?Tina Francis is a 36 y.o.  y/o female who comes in today with constipation, weight loss and early satiety.  The patient has a grandmother with a wheat allergy.  The patient will have her blood sent off for celiac sprue.  The patient will also be set up for an EGD and colonoscopy due to her change in bowel habits, ribbonlike stools and unexplained weight loss.  The patient has been explained the plan and agrees with it. ? ? ? ?Lucilla Lame, MD. Marval Regal ? ? ? Note: This dictation was prepared with Dragon dictation along with smaller phrase technology. Any transcriptional errors that result from this process are  unintentional.   ?

## 2021-06-06 NOTE — H&P (View-Only) (Signed)
? ? ?Gastroenterology Consultation ? ?Referring Provider:     Holdsworth, Karen, NP ?Primary Care Physician:  Francis, Karen, NP ?Primary Gastroenterologist:  Dr. Dushaun Francis     ?Reason for Consultation:     Weight loss ?      ? HPI:   ?Tina Francis is a 35 y.o. y/o female referred for consultation & management of weight loss by Dr. Holdsworth, Karen, NP.  This patient comes in today after contacting her primary care physician for continued weight loss and a decrease in appetite.  In June 2022 she was at her doctor's office and her weight was 182lbs with the following November being 178 and then 173 that December. She was 171 today. She has some constipation sometimes. She has had weight loss for a year. She has a family history of wheat allergy in a grandmother. She reports ribbon like stools.  The patient denies any black stools or bloody stools.  He denies any family history of colon cancer or colon polyps.  The patient also reports some early satiety.  Her appetite has not gone down and she states she is eating the same food she was eating before.  The husband reports that she does feel bloated in the upper abdomen whenever she does eat wheat products. ? ?Past Medical History:  ?Diagnosis Date  ? Infertility   ? Vacuum extractor delivery, delivered 04/29/2013  ? ? ?Past Surgical History:  ?Procedure Laterality Date  ? NO PAST SURGERIES    ? ? ?Prior to Admission medications   ?Medication Sig Start Date End Date Taking? Authorizing Provider  ?triamcinolone (KENALOG) 0.025 % ointment Apply 1 application topically 2 (two) times daily. 03/14/21   Francis, Karen, NP  ? ? ?Family History  ?Problem Relation Age of Onset  ? Heart attack Mother   ? Hypertension Mother   ? Diabetes Maternal Grandmother   ? Heart attack Father   ? Autism spectrum disorder Son   ?  ? ?Social History  ? ?Tobacco Use  ? Smoking status: Never  ? Smokeless tobacco: Never  ?Vaping Use  ? Vaping Use: Never used  ?Substance Use Topics  ?  Alcohol use: No  ? Drug use: No  ? ? ?Allergies as of 06/06/2021  ? (No Known Allergies)  ? ? ?Review of Systems:    ?All systems reviewed and negative except where noted in HPI. ? ? Physical Exam:  ?There were no vitals taken for this visit. ?No LMP recorded. ?General:   Alert,  Well-developed, well-nourished, pleasant and cooperative in NAD ?Head:  Normocephalic and atraumatic. ?Eyes:  Sclera clear, no icterus.   Conjunctiva pink. ?Ears:  Normal auditory acuity. ?Neck:  Supple; no masses or thyromegaly. ?Lungs:  Respirations even and unlabored.  Clear throughout to auscultation.   No wheezes, crackles, or rhonchi. No acute distress. ?Heart:  Regular rate and rhythm; no murmurs, clicks, rubs, or gallops. ?Abdomen:  Normal bowel sounds.  No bruits.  Soft, non-tender and non-distended without masses, hepatosplenomegaly or hernias noted.  No guarding or rebound tenderness.  Negative Carnett sign.   ?Rectal:  Deferred.  ?Pulses:  Normal pulses noted. ?Extremities:  No clubbing or edema.  No cyanosis. ?Neurologic:  Alert and oriented x3;  grossly normal neurologically. ?Skin:  Intact without significant lesions or rashes.  No jaundice. ?Lymph Nodes:  No significant cervical adenopathy. ?Psych:  Alert and cooperative. Normal mood and affect. ? ?Imaging Studies: ?No results found. ? ?Assessment and Plan:  ? ?Tina Francis is a 35 y.o.   y/o female who comes in today with constipation, weight loss and early satiety.  The patient has a grandmother with a wheat allergy.  The patient will have her blood sent off for celiac sprue.  The patient will also be set up for an EGD and colonoscopy due to her change in bowel habits, ribbonlike stools and unexplained weight loss.  The patient has been explained the plan and agrees with it. ? ? ? ?Tina Faith, MD. FACG ? ? ? Note: This dictation was prepared with Dragon dictation along with smaller phrase technology. Any transcriptional errors that result from this process are  unintentional.   ?

## 2021-06-06 NOTE — Addendum Note (Signed)
Addended by: Roena Malady on: 06/06/2021 10:35 AM ? ? Modules accepted: Orders ? ?

## 2021-06-06 NOTE — Addendum Note (Signed)
Addended by: Roena Malady on: 06/06/2021 11:53 AM ? ? Modules accepted: Orders ? ?

## 2021-06-10 LAB — CELIAC DISEASE PANEL
Endomysial IgA: NEGATIVE
IgA/Immunoglobulin A, Serum: 296 mg/dL (ref 87–352)
Transglutaminase IgA: <2 U/mL (ref 0–3)

## 2021-06-18 ENCOUNTER — Telehealth: Payer: Self-pay

## 2021-06-18 ENCOUNTER — Telehealth: Payer: Self-pay | Admitting: Gastroenterology

## 2021-06-18 ENCOUNTER — Encounter: Payer: Self-pay | Admitting: Gastroenterology

## 2021-06-18 NOTE — Telephone Encounter (Signed)
I spoke to pt and she just wanted to know if it would be okay to take the prep for procedure being that she had an increase in GERD Sx... I went over the other alternatives and explained the difference. Pt expressed understanding ?

## 2021-06-18 NOTE — Telephone Encounter (Signed)
I spoke to pt and she just wanted to know if it would be okay to take the prep for procedure being that she had an increase in GERD Sx... I went over the other alternatives and explained the difference. Pt expressed understanding ?

## 2021-06-18 NOTE — Telephone Encounter (Signed)
Patient is calling wanting to know is it ok for her to take the prep medicine she is having reflux already and thinks she will just throw it back up I offered to send in zofran for her but she doenst want zofran she says she is also having a hard time swallowing and might not can swallow mediciene  ?

## 2021-06-18 NOTE — Telephone Encounter (Signed)
Patient  left vm, states she has a few questions in reference to her colonoscopy. Requesting call back. ?

## 2021-06-20 ENCOUNTER — Telehealth: Payer: Self-pay

## 2021-06-20 NOTE — Telephone Encounter (Signed)
Patient verbalized understanding of results  

## 2021-06-20 NOTE — Telephone Encounter (Signed)
-----   Message from Midge Minium, MD sent at 06/10/2021  7:53 AM EDT ----- ?Please let the patient know that the celiac sprue panel meaning a allergy to wheat was negative thereby indicating that the patient is not allergic to wheat. ?

## 2021-06-21 ENCOUNTER — Encounter: Payer: Self-pay | Admitting: Gastroenterology

## 2021-06-21 ENCOUNTER — Ambulatory Visit
Admission: RE | Admit: 2021-06-21 | Discharge: 2021-06-21 | Disposition: A | Discharge status: Home / Self Care | Payer: Managed Care, Other (non HMO) | Source: Ambulatory Visit | Attending: Gastroenterology | Admitting: Gastroenterology

## 2021-06-21 ENCOUNTER — Ambulatory Visit: Payer: Managed Care, Other (non HMO) | Admitting: Anesthesiology

## 2021-06-21 ENCOUNTER — Encounter
Admission: RE | Disposition: A | Discharge status: Home / Self Care | Payer: Self-pay | Source: Ambulatory Visit | Attending: Gastroenterology

## 2021-06-21 ENCOUNTER — Other Ambulatory Visit: Payer: Self-pay

## 2021-06-21 DIAGNOSIS — R194 Change in bowel habit: Secondary | ICD-10-CM

## 2021-06-21 DIAGNOSIS — K6389 Other specified diseases of intestine: Secondary | ICD-10-CM | POA: Insufficient documentation

## 2021-06-21 DIAGNOSIS — Z8489 Family history of other specified conditions: Secondary | ICD-10-CM | POA: Diagnosis not present

## 2021-06-21 DIAGNOSIS — Z6829 Body mass index (BMI) 29.0-29.9, adult: Secondary | ICD-10-CM | POA: Insufficient documentation

## 2021-06-21 DIAGNOSIS — R195 Other fecal abnormalities: Secondary | ICD-10-CM | POA: Diagnosis not present

## 2021-06-21 DIAGNOSIS — R634 Abnormal weight loss: Secondary | ICD-10-CM | POA: Diagnosis not present

## 2021-06-21 DIAGNOSIS — R6881 Early satiety: Secondary | ICD-10-CM

## 2021-06-21 HISTORY — PX: ESOPHAGOGASTRODUODENOSCOPY: SHX5428

## 2021-06-21 HISTORY — PX: COLONOSCOPY: SHX5424

## 2021-06-21 HISTORY — DX: Other specified health status: Z78.9

## 2021-06-21 HISTORY — DX: Anemia, unspecified: D64.9

## 2021-06-21 HISTORY — DX: Gastro-esophageal reflux disease without esophagitis: K21.9

## 2021-06-21 LAB — POCT PREGNANCY, URINE: Preg Test, Ur: NEGATIVE

## 2021-06-21 SURGERY — COLONOSCOPY
Anesthesia: General | Site: Throat

## 2021-06-21 MED ORDER — ACETAMINOPHEN 160 MG/5ML PO SOLN: 975.0000 mg | Freq: Once | ORAL | Status: DC | PRN

## 2021-06-21 MED ORDER — ACETAMINOPHEN 500 MG PO TABS: 1000.0000 mg | ORAL_TABLET | Freq: Once | ORAL | Status: DC | PRN

## 2021-06-21 MED ORDER — PROPOFOL 10 MG/ML IV BOLUS
INTRAVENOUS | Status: DC | PRN
  Administered 2021-06-21: 20 mg via INTRAVENOUS
  Administered 2021-06-21: 150 mg via INTRAVENOUS
  Administered 2021-06-21: 20 mg via INTRAVENOUS
  Administered 2021-06-21: 50 mg via INTRAVENOUS
  Administered 2021-06-21 (×2): 30 mg via INTRAVENOUS
  Administered 2021-06-21 (×2): 20 mg via INTRAVENOUS

## 2021-06-21 MED ORDER — LIDOCAINE HCL (CARDIAC) PF 100 MG/5ML IV SOSY
PREFILLED_SYRINGE | INTRAVENOUS | Status: DC | PRN
  Administered 2021-06-21: 50 mg via INTRAVENOUS

## 2021-06-21 MED ORDER — ONDANSETRON HCL 4 MG/2ML IJ SOLN: 4.0000 mg | Freq: Once | INTRAMUSCULAR | Status: DC | PRN

## 2021-06-21 MED ORDER — SODIUM CHLORIDE 0.9 % IV SOLN: INTRAVENOUS | Status: DC

## 2021-06-21 MED ORDER — STERILE WATER FOR IRRIGATION IR SOLN
Status: DC | PRN
  Administered 2021-06-21: 500 mL

## 2021-06-21 MED ORDER — LACTATED RINGERS IV SOLN: INTRAVENOUS | Status: DC

## 2021-06-21 SURGICAL SUPPLY — 7 items
FORCEPS BIOP RAD 4 LRG CAP 4 (CUTTING FORCEPS) ×1 IMPLANT
GOWN CVR UNV OPN BCK APRN NK (MISCELLANEOUS) ×4 IMPLANT
GOWN ISOL THUMB LOOP REG UNIV (MISCELLANEOUS) ×6
KIT PRC NS LF DISP ENDO (KITS) ×2 IMPLANT
KIT PROCEDURE OLYMPUS (KITS) ×3
MANIFOLD NEPTUNE II (INSTRUMENTS) ×3 IMPLANT
WATER STERILE IRR 250ML POUR (IV SOLUTION) ×3 IMPLANT

## 2021-06-21 NOTE — Anesthesia Procedure Notes (Signed)
Date/Time: 06/21/2021 9:56 AM ?Performed by: Maree Krabbe, CRNA ?Pre-anesthesia Checklist: Patient identified, Emergency Drugs available, Suction available, Timeout performed and Patient being monitored ?Patient Re-evaluated:Patient Re-evaluated prior to induction ?Oxygen Delivery Method: Nasal cannula ?Placement Confirmation: positive ETCO2 ? ? ? ? ?

## 2021-06-21 NOTE — Op Note (Addendum)
[facility] ?Gastroenterology ?Patient Name: Tina Francis ?Procedure Date: 06/21/2021 9:43 AM ?MRN: [id number] ?Account #: [id number] ?Date of Birth: [birth year] ?Admit Type: Outpatient ?Age: 36 ?Room: [facility] OR ROOM 01 ?Gender: Female ?Note Status: Finalized ?Instrument Name: [id number] ?Procedure:             Colonoscopy ?Indications:           Weight loss ?Providers:             Lucilla Lame MD, MD ?Medicines:             Propofol per Anesthesia ?Complications:         No immediate complications. ?Procedure:             Pre-Anesthesia Assessment: ?                       - Prior to the procedure, a History and Physical was  ?                       performed, and patient medications and allergies were  ?                       reviewed. The patient's tolerance of previous  ?                       anesthesia was also reviewed. The risks and benefits  ?                       of the procedure and the sedation options and risks  ?                       were discussed with the patient. All questions were  ?                       answered, and informed consent was obtained. Prior  ?                       Anticoagulants: The patient has taken no previous  ?                       anticoagulant or antiplatelet agents. ASA Grade  ?                       Assessment: II - A patient with mild systemic disease.  ?                       After reviewing the risks and benefits, the patient  ?                       was deemed in satisfactory condition to undergo the  ?                       procedure. ?                       After obtaining informed consent, the colonoscope was  ?                       passed under direct vision. Throughout the procedure,  ?  the patient's blood pressure, pulse, and oxygen  ?                       saturations were monitored continuously. The  ?                       Colonoscope was introduced through the anus and  ?                       advanced to the the terminal  ileum. The colonoscopy  ?                       was performed without difficulty. The patient  ?                       tolerated the procedure well. The quality of the bowel  ?                       preparation was excellent. ?Findings: ?     The perianal and digital rectal examinations were normal. ?     The terminal ileum appeared normal. Biopsies were taken with a cold  ?     forceps for histology. ?     The colon (entire examined portion) appeared normal. Biopsies were taken  ?     with a cold forceps for histology. ?Impression:            - The examined portion of the ileum was normal.  ?                       Biopsied. ?                       - The entire examined colon is normal. Biopsied. ?Recommendation:        - Discharge patient to home. ?                       - Resume previous diet. ?                       - Continue present medications. ?                       - Await pathology results. ?Procedure Code(s):     --- Professional --- ?                       [id number], Colonoscopy, flexible; with biopsy, single or  ?                       multiple ?Diagnosis Code(s):     --- Professional --- ?                       R63.4, Abnormal weight loss ?CPT copyright 2019 American Medical Association. All rights reserved. ?The codes documented in this report are preliminary and upon coder review may  ?be revised to meet current compliance requirements. ?Lucilla Lame MD, MD ?06/21/2021 10:18:47 AM ?This report has been signed electronically. ?Number of Addenda: 0 ?Note Initiated On: 06/21/2021 9:43 AM ?Scope Withdrawal Time: 0 hours 6 minutes 42 seconds  ?Total Procedure Duration: 0 hours 9 minutes 36 seconds  ?  Estimated Blood Loss:  Estimated blood loss: none. ?     [facility] ?

## 2021-06-21 NOTE — Anesthesia Preprocedure Evaluation (Signed)
Anesthesia Evaluation  ?Patient identified by MRN, date of birth, ID band ?Patient awake ? ? ? ?Reviewed: ?Allergy & Precautions, H&P , NPO status , Patient's Chart, lab work & pertinent test results, reviewed documented beta blocker date and time  ? ?Airway ?Mallampati: II ? ?TM Distance: >3 FB ?Neck ROM: full ? ? ? Dental ?no notable dental hx. ? ?  ?Pulmonary ?neg pulmonary ROS,  ?  ?Pulmonary exam normal ?breath sounds clear to auscultation ? ? ? ? ? ? Cardiovascular ?Exercise Tolerance: Good ?negative cardio ROS ? ? ?Rhythm:regular Rate:Normal ? ? ?  ?Neuro/Psych ?negative neurological ROS ? negative psych ROS  ? GI/Hepatic ?negative GI ROS, Neg liver ROS, GERD  ,  ?Endo/Other  ?negative endocrine ROS ? Renal/GU ?negative Renal ROS  ?negative genitourinary ?  ?Musculoskeletal ? ? Abdominal ?  ?Peds ? Hematology ?negative hematology ROS ?(+) Blood dyscrasia, anemia ,   ?Anesthesia Other Findings ? ? Reproductive/Obstetrics ?negative OB ROS ? ?  ? ? ? ? ? ? ? ? ? ? ? ? ? ?  ?  ? ? ? ? ? ? ? ? ?Anesthesia Physical ?Anesthesia Plan ? ?ASA: 2 ? ?Anesthesia Plan: General  ? ?Post-op Pain Management:   ? ?Induction:  ? ?PONV Risk Score and Plan: 3 and TIVA, Propofol infusion and Treatment may vary due to age or medical condition ? ?Airway Management Planned:  ? ?Additional Equipment:  ? ?Intra-op Plan:  ? ?Post-operative Plan:  ? ?Informed Consent: I have reviewed the patients History and Physical, chart, labs and discussed the procedure including the risks, benefits and alternatives for the proposed anesthesia with the patient or authorized representative who has indicated his/her understanding and acceptance.  ? ? ? ?Dental Advisory Given ? ?Plan Discussed with: CRNA ? ?Anesthesia Plan Comments:   ? ? ? ? ? ? ?Anesthesia Quick Evaluation ? ?

## 2021-06-21 NOTE — Transfer of Care (Signed)
Immediate Anesthesia Transfer of Care Note ? ?Patient: Tina Francis ? ?Procedure(s) Performed: COLONOSCOPY (Rectum) ?ESOPHAGOGASTRODUODENOSCOPY (EGD) (Throat) ? ?Patient Location: PACU ? ?Anesthesia Type: General ? ?Level of Consciousness: awake, alert  and patient cooperative ? ?Airway and Oxygen Therapy: Patient Spontanous Breathing and Patient connected to supplemental oxygen ? ?Post-op Assessment: Post-op Vital signs reviewed, Patient's Cardiovascular Status Stable, Respiratory Function Stable, Patent Airway and No signs of Nausea or vomiting ? ?Post-op Vital Signs: Reviewed and stable ? ?Complications: No notable events documented. ? ?

## 2021-06-21 NOTE — Interval H&P Note (Signed)
? ?  Midge Minium, MD [facility] ?[phone] ?[place], [place] [address] ?[phone] ?Fax : [phone] ? ?Primary Care Physician:  Larae Grooms, NP ?Primary Gastroenterologist:  Dr. Servando Snare ? ?Pre-Procedure History & Physical: ?HPI:  Tina Francis is a 36 y.o. female is here for an endoscopy and colonoscopy. ?  ?Past Medical History:  ?Diagnosis Date  ? Anemia   ? GERD (gastroesophageal reflux disease)   ? Infertility   ? Vacuum extractor delivery, delivered 04/29/2013  ? Vegetarian   ? ? ?Past Surgical History:  ?Procedure Laterality Date  ? WISDOM TOOTH EXTRACTION  03/2021  ? ? ?Prior to Admission medications   ?Not on File  ? ? ?Allergies as of 06/06/2021  ? (No Known Allergies)  ? ? ?Family History  ?Problem Relation Age of Onset  ? Heart attack Mother   ? Hypertension Mother   ? Diabetes Maternal Grandmother   ? Heart attack Father   ? Autism spectrum disorder Son   ? ? ?Social History  ? ?Socioeconomic History  ? Marital status: Married  ?  Spouse name: Not on file  ? Number of children: Not on file  ? Years of education: Not on file  ? Highest education level: Not on file  ?Occupational History  ? Not on file  ?Tobacco Use  ? Smoking status: Never  ? Smokeless tobacco: Never  ?Vaping Use  ? Vaping Use: Never used  ?Substance and Sexual Activity  ? Alcohol use: No  ? Drug use: No  ? Sexual activity: Yes  ?Other Topics Concern  ? Not on file  ?Social History Narrative  ? Not on file  ? ?Social Determinants of Health  ? ?Financial Resource Strain: Not on file  ?Food Insecurity: Not on file  ?Transportation Needs: Not on file  ?Physical Activity: Not on file  ?Stress: Not on file  ?Social Connections: Not on file  ?Intimate Partner Violence: Not on file  ? ? ?Review of Systems: ?See HPI, otherwise negative ROS ? ?Physical Exam: ?BP 118/84   Pulse 92   Temp (!) 97.2 ?F (36.2 ?C) (Temporal)   Ht 5\' 3"  (1.6 m)   Wt 74.4 kg   LMP 06/09/2021 (Exact Date)   SpO2 99%   BMI 29.05 kg/m?  ?General:    Alert,  pleasant and cooperative in NAD ?Head:  Normocephalic and atraumatic. ?Neck:  Supple; no masses or thyromegaly. ?Lungs:  Clear throughout to auscultation.    ?Heart:  Regular rate and rhythm. ?Abdomen:  Soft, nontender and nondistended. Normal bowel sounds, without guarding, and without rebound.   ?Neurologic:  Alert and  oriented x4;  grossly normal neurologically. ? ?Impression/Plan: ?Tina Francis is here for an endoscopy and colonoscopy to be performed for early satiety and weight loss ? ?Risks, benefits, limitations, and alternatives regarding  endoscopy and colonoscopy have been reviewed with the patient.  Questions have been answered.  All parties agreeable. ? ? ?Nicanor Alcon, MD  06/21/2021, 9:46 AM ?

## 2021-06-21 NOTE — Op Note (Signed)
[facility] ?Gastroenterology ?Patient Name: Tina Francis ?Procedure Date: 06/21/2021 9:44 AM ?MRN: [id number] ?Account #: [id number] ?Date of Birth: Aug 10, 1985 ?Admit Type: Outpatient ?Age: 36 ?Room: [facility] OR ROOM 01 ?Gender: Female ?Note Status: Finalized ?Instrument Name: [id number] ?Procedure:             Upper GI endoscopy ?Indications:           Early satiety, Weight loss ?Providers:             Midge Minium MD, MD ?Referring MD:          Larae Grooms (Referring MD) ?Medicines:             Propofol per Anesthesia ?Complications:         No immediate complications. ?Procedure:             Pre-Anesthesia Assessment: ?                       - Prior to the procedure, a History and Physical was  ?                       performed, and patient medications and allergies were  ?                       reviewed. The patient's tolerance of previous  ?                       anesthesia was also reviewed. The risks and benefits  ?                       of the procedure and the sedation options and risks  ?                       were discussed with the patient. All questions were  ?                       answered, and informed consent was obtained. Prior  ?                       Anticoagulants: The patient has taken no previous  ?                       anticoagulant or antiplatelet agents. ASA Grade  ?                       Assessment: II - A patient with mild systemic disease.  ?                       After reviewing the risks and benefits, the patient  ?                       was deemed in satisfactory condition to undergo the  ?                       procedure. ?                       After obtaining informed consent, the endoscope was  ?  passed under direct vision. Throughout the procedure,  ?                       the patient's blood pressure, pulse, and oxygen  ?                       saturations were monitored continuously. The Endoscope  ?                       was introduced  through the mouth, and advanced to the  ?                       second part of duodenum. The upper GI endoscopy was  ?                       accomplished without difficulty. The patient tolerated  ?                       the procedure well. ?Findings: ?     The examined esophagus was normal. ?     The entire examined stomach was normal. ?     The examined duodenum was normal. Biopsies were taken with a cold  ?     forceps for histology. ?Impression:            - Normal esophagus. ?                       - Normal stomach. ?                       - Normal examined duodenum. Biopsied. ?Recommendation:        - Discharge patient to home. ?                       - Resume previous diet. ?                       - Continue present medications. ?                       - Await pathology results. ?                       - Perform a colonoscopy today. ?Procedure Code(s):     --- Professional --- ?                       [phone], Esophagogastroduodenoscopy, flexible,  ?                       transoral; with biopsy, single or multiple ?Diagnosis Code(s):     --- Professional --- ?                       R68.81, Early satiety ?                       R63.4, Abnormal weight loss ?CPT copyright 2019 American Medical Association. All rights reserved. ?The codes documented in this report are preliminary and upon coder review may  ?be revised to meet current compliance requirements. ?Midge Minium MD, MD ?06/21/2021 10:04:08 AM ?This report has been signed electronically. ?  Number of Addenda: 0 ?Note Initiated On: 06/21/2021 9:44 AM ?Total Procedure Duration: 0 hours 1 minute 50 seconds  ?Estimated Blood Loss:  Estimated blood loss: none. ?     [facility] ?

## 2021-06-21 NOTE — Anesthesia Postprocedure Evaluation (Signed)
Anesthesia Post Note ? ?Patient: Tina Francis ? ?Procedure(s) Performed: COLONOSCOPY (Rectum) ?ESOPHAGOGASTRODUODENOSCOPY (EGD) (Throat) ? ? ?  ?Patient location during evaluation: PACU ?Anesthesia Type: General ?Level of consciousness: awake and alert ?Pain management: pain level controlled ?Vital Signs Assessment: post-procedure vital signs reviewed and stable ?Respiratory status: spontaneous breathing, nonlabored ventilation and respiratory function stable ?Cardiovascular status: blood pressure returned to baseline and stable ?Postop Assessment: no apparent nausea or vomiting ?Anesthetic complications: no ? ? ?No notable events documented. ? ?Loni Beckwith ? ? ? ? ? ?

## 2021-06-22 ENCOUNTER — Encounter: Payer: Self-pay | Admitting: Gastroenterology

## 2021-06-22 LAB — SURGICAL PATHOLOGY

## 2021-06-25 ENCOUNTER — Ambulatory Visit: Payer: Managed Care, Other (non HMO) | Admitting: Gastroenterology

## 2021-07-10 ENCOUNTER — Encounter: Payer: Self-pay | Admitting: Nurse Practitioner

## 2021-07-10 MED ORDER — PANTOPRAZOLE SODIUM 40 MG PO TBEC
40.0000 mg | DELAYED_RELEASE_TABLET | Freq: Every day | ORAL | 3 refills | Status: DC
Start: 2021-07-10 — End: 2021-11-21

## 2021-07-10 NOTE — Addendum Note (Signed)
Addended by: Roena Malady on: 07/10/2021 05:46 PM ? ? Modules accepted: Orders ? ?

## 2021-07-11 ENCOUNTER — Encounter: Payer: Self-pay | Admitting: Nurse Practitioner

## 2021-07-11 ENCOUNTER — Ambulatory Visit: Payer: Managed Care, Other (non HMO) | Admitting: Nurse Practitioner

## 2021-07-11 VITALS — BP 125/86 | HR 93 | Temp 98.4°F | Wt 167.4 lb

## 2021-07-11 DIAGNOSIS — N912 Amenorrhea, unspecified: Secondary | ICD-10-CM | POA: Diagnosis not present

## 2021-07-11 DIAGNOSIS — Z6833 Body mass index (BMI) 33.0-33.9, adult: Secondary | ICD-10-CM

## 2021-07-11 DIAGNOSIS — R634 Abnormal weight loss: Secondary | ICD-10-CM | POA: Diagnosis not present

## 2021-07-11 DIAGNOSIS — R7309 Other abnormal glucose: Secondary | ICD-10-CM

## 2021-07-11 DIAGNOSIS — E6609 Other obesity due to excess calories: Secondary | ICD-10-CM

## 2021-07-11 NOTE — Progress Notes (Signed)
? ?BP 125/86   Pulse 93   Temp 98.4 ?F (36.9 ?C) (Oral)   Wt 167 lb 6.4 oz (75.9 kg)   SpO2 98%   BMI 29.65 kg/m?   ? ?Subjective:  ? ? Patient ID: Tina Francis, female    DOB: Nov 25, 1985, 36 y.o.   MRN: [id number] ? ?HPI: ?Tina Francis is a 36 y.o. female ? ?Chief Complaint  ?Patient presents with  ? Weight Loss  ?  Pt states she is unintentionally losing weight.   ? ?WEIGHT LOSS ?Patient states she is late for her period and has continued to lose weight.  Patient had endoscopy and colonoscopy recently as well as celiac work up and everything was negative.  Denies any diarrhea.  She does have some constipation.  States it is on and off.  ? ?Relevant past medical, surgical, family and social history reviewed and updated as indicated. Interim medical history since our last visit reviewed. ?Allergies and medications reviewed and updated. ? ?Review of Systems  ?Gastrointestinal:  Positive for constipation. Negative for diarrhea and nausea.  ? ?Per HPI unless specifically indicated above ? ?   ?Objective:  ?  ?BP 125/86   Pulse 93   Temp 98.4 ?F (36.9 ?C) (Oral)   Wt 167 lb 6.4 oz (75.9 kg)   SpO2 98%   BMI 29.65 kg/m?   ?Wt Readings from Last 3 Encounters:  ?07/11/21 167 lb 6.4 oz (75.9 kg)  ?06/21/21 164 lb (74.4 kg)  ?06/06/21 171 lb (77.6 kg)  ?  ?Physical Exam ?Vitals and nursing note reviewed.  ?Constitutional:   ?   General: She is not in acute distress. ?   Appearance: Normal appearance. She is normal weight. She is not ill-appearing, toxic-appearing or diaphoretic.  ?HENT:  ?   Head: Normocephalic.  ?   Right Ear: External ear normal.  ?   Left Ear: External ear normal.  ?   Nose: Nose normal.  ?   Mouth/Throat:  ?   Mouth: Mucous membranes are moist.  ?   Pharynx: Oropharynx is clear.  ?Eyes:  ?   General:     ?   Right eye: No discharge.     ?   Left eye: No discharge.  ?   Extraocular Movements: Extraocular movements intact.  ?   Conjunctiva/sclera: Conjunctivae normal.  ?   Pupils: Pupils  are equal, round, and reactive to light.  ?Cardiovascular:  ?   Rate and Rhythm: Normal rate and regular rhythm.  ?   Heart sounds: No murmur heard. ?Pulmonary:  ?   Effort: Pulmonary effort is normal. No respiratory distress.  ?   Breath sounds: Normal breath sounds. No wheezing or rales.  ?Musculoskeletal:  ?   Cervical back: Normal range of motion and neck supple.  ?Skin: ?   General: Skin is warm and dry.  ?   Capillary Refill: Capillary refill takes less than 2 seconds.  ?Neurological:  ?   General: No focal deficit present.  ?   Mental Status: She is alert and oriented to person, place, and time. Mental status is at baseline.  ?Psychiatric:     ?   Mood and Affect: Mood normal.     ?   Behavior: Behavior normal.     ?   Thought Content: Thought content normal.     ?   Judgment: Judgment normal.  ? ? ?Results for orders placed or performed in visit on 07/11/21  ?Comp Met (CMET)  ?Result Value  Ref Range  ? Glucose 94 70 - 99 mg/dL  ? BUN 6 6 - 20 mg/dL  ? Creatinine, Ser 0.66 0.57 - 1.00 mg/dL  ? eGFR 118 >59 mL/min/1.73  ? BUN/Creatinine Ratio 9 9 - 23  ? Sodium 138 134 - 144 mmol/L  ? Potassium 3.6 3.5 - 5.2 mmol/L  ? Chloride 100 96 - 106 mmol/L  ? CO2 23 20 - 29 mmol/L  ? Calcium 9.7 8.7 - 10.2 mg/dL  ? Total Protein 7.5 6.0 - 8.5 g/dL  ? Albumin 4.6 3.8 - 4.8 g/dL  ? Globulin, Total 2.9 1.5 - 4.5 g/dL  ? Albumin/Globulin Ratio 1.6 1.2 - 2.2  ? Bilirubin Total 0.2 0.0 - 1.2 mg/dL  ? Alkaline Phosphatase 87 44 - 121 IU/L  ? AST 13 0 - 40 IU/L  ? ALT 10 0 - 32 IU/L  ?HgB A1c  ?Result Value Ref Range  ? Hgb A1c MFr Bld 5.6 4.8 - 5.6 %  ? Est. average glucose Bld gHb Est-mCnc 114 mg/dL  ?Anemia Profile B  ?Result Value Ref Range  ? Total Iron Binding Capacity 428 250 - 450 ug/dL  ? UIBC 382 131 - 425 ug/dL  ? Iron 46 27 - 159 ug/dL  ? Iron Saturation 11 (L) 15 - 55 %  ? Ferritin 10 (L) 15 - 150 ng/mL  ? Vitamin B-12 138 (L) 232 - 1,245 pg/mL  ? Folate 7.6 >3.0 ng/mL  ? WBC 9.9 3.4 - 10.8 x10E3/uL  ? RBC 5.14  3.77 - 5.28 x10E6/uL  ? Hemoglobin 12.5 11.1 - 15.9 g/dL  ? Hematocrit 39.5 34.0 - 46.6 %  ? MCV 77 (L) 79 - 97 fL  ? MCH 24.3 (L) 26.6 - 33.0 pg  ? MCHC 31.6 31.5 - 35.7 g/dL  ? RDW 14.2 11.7 - 15.4 %  ? Platelets 273 150 - 450 x10E3/uL  ? Neutrophils 69 Not Estab. %  ? Lymphs 23 Not Estab. %  ? Monocytes 7 Not Estab. %  ? Eos 1 Not Estab. %  ? Basos 0 Not Estab. %  ? Neutrophils Absolute 6.8 1.4 - 7.0 x10E3/uL  ? Lymphocytes Absolute 2.2 0.7 - 3.1 x10E3/uL  ? Monocytes Absolute 0.7 0.1 - 0.9 x10E3/uL  ? EOS (ABSOLUTE) 0.1 0.0 - 0.4 x10E3/uL  ? Basophils Absolute 0.0 0.0 - 0.2 x10E3/uL  ? Immature Granulocytes 0 Not Estab. %  ? Immature Grans (Abs) 0.0 0.0 - 0.1 x10E3/uL  ? Retic Ct Pct 1.2 0.6 - 2.6 %  ?Thyroid peroxidase antibody  ?Result Value Ref Range  ? Thyroperoxidase Ab SerPl-aCnc <9 0 - 34 IU/mL  ?Thyroid Panel With TSH  ?Result Value Ref Range  ? TSH 2.800 0.450 - 4.500 uIU/mL  ? T4, Total 7.6 4.5 - 12.0 ug/dL  ? T3 Uptake Ratio 25 24 - 39 %  ? Free Thyroxine Index 1.9 1.2 - 4.9  ?Beta hCG quant (ref lab)  ?Result Value Ref Range  ? hCG Quant <1 mIU/mL  ? ?   ?Assessment & Plan:  ? ?Problem List Items Addressed This Visit   ? ?  ? Other  ? Obesity - Primary  ? Relevant Orders  ? HgB A1c (Completed)  ? Loss of weight  ?  Ongoing. Patient has seen GI and did an Endoscopy/Colonoscopy and worked her up for Celiac.  Results were unremarkable and recommended she follow up with PCP.  Will repeat labs to rule out thyroid problem, anemia, and diabetes.  Patient gained  3 lbs since last visit. Will make recommendations based on lab results.  ? ?  ?  ? ?Other Visit Diagnoses   ? ? Weight loss      ? Relevant Orders  ? Comp Met (CMET) (Completed)  ? HgB A1c (Completed)  ? Anemia Profile B (Completed)  ? Thyroid peroxidase antibody (Completed)  ? Thyroid Panel With TSH (Completed)  ? Elevated glucose      ? Will check A1c.  Will make recommendations based on lab results.   ? Relevant Orders  ? HgB A1c (Completed)  ?  Amenorrhea      ? Has not started her period.  Requesting pregnancy test in office.   ? Relevant Orders  ? Beta hCG quant (ref lab) (Completed)  ? ?  ?  ? ?Follow up plan: ?Return if symptoms worsen or fail to improve. ? ? ? ? ? ?

## 2021-07-12 LAB — COMPREHENSIVE METABOLIC PANEL
ALT: 10 IU/L (ref 0–32)
AST: 13 IU/L (ref 0–40)
Albumin/Globulin Ratio: 1.6 (ref 1.2–2.2)
Albumin: 4.6 g/dL (ref 3.8–4.8)
Alkaline Phosphatase: 87 IU/L (ref 44–121)
BUN/Creatinine Ratio: 9 (ref 9–23)
BUN: 6 mg/dL (ref 6–20)
Bilirubin Total: 0.2 mg/dL (ref 0.0–1.2)
CO2: 23 mmol/L (ref 20–29)
Calcium: 9.7 mg/dL (ref 8.7–10.2)
Chloride: 100 mmol/L (ref 96–106)
Creatinine, Ser: 0.66 mg/dL (ref 0.57–1.00)
Globulin, Total: 2.9 g/dL (ref 1.5–4.5)
Glucose: 94 mg/dL (ref 70–99)
Potassium: 3.6 mmol/L (ref 3.5–5.2)
Sodium: 138 mmol/L (ref 134–144)
Total Protein: 7.5 g/dL (ref 6.0–8.5)
eGFR: 118 mL/min/{1.73_m2} (ref >59)

## 2021-07-12 LAB — ANEMIA PROFILE B
Basophils Absolute: 0 10*3/uL (ref 0.0–0.2)
Basos: 0 %
EOS (ABSOLUTE): 0.1 10*3/uL (ref 0.0–0.4)
Eos: 1 %
Ferritin: 10 ng/mL — ABNORMAL LOW (ref 15–150)
Folate: 7.6 ng/mL (ref >3.0)
Hematocrit: 39.5 % (ref 34.0–46.6)
Hemoglobin: 12.5 g/dL (ref 11.1–15.9)
Immature Grans (Abs): 0 10*3/uL (ref 0.0–0.1)
Immature Granulocytes: 0 %
Iron Saturation: 11 % — ABNORMAL LOW (ref 15–55)
Iron: 46 ug/dL (ref 27–159)
Lymphocytes Absolute: 2.2 10*3/uL (ref 0.7–3.1)
Lymphs: 23 %
MCH: 24.3 pg — ABNORMAL LOW (ref 26.6–33.0)
MCHC: 31.6 g/dL (ref 31.5–35.7)
MCV: 77 fL — ABNORMAL LOW (ref 79–97)
Monocytes Absolute: 0.7 10*3/uL (ref 0.1–0.9)
Monocytes: 7 %
Neutrophils Absolute: 6.8 10*3/uL (ref 1.4–7.0)
Neutrophils: 69 %
Platelets: 273 10*3/uL (ref 150–450)
RBC: 5.14 x10E6/uL (ref 3.77–5.28)
RDW: 14.2 % (ref 11.7–15.4)
Retic Ct Pct: 1.2 % (ref 0.6–2.6)
Total Iron Binding Capacity: 428 ug/dL (ref 250–450)
UIBC: 382 ug/dL (ref 131–425)
Vitamin B-12: 138 pg/mL — ABNORMAL LOW (ref 232–1245)
WBC: 9.9 10*3/uL (ref 3.4–10.8)

## 2021-07-12 LAB — THYROID PEROXIDASE ANTIBODY: Thyroperoxidase Ab SerPl-aCnc: <9 IU/mL (ref 0–34)

## 2021-07-12 LAB — BETA HCG QUANT (REF LAB): hCG Quant: <1 m[IU]/mL

## 2021-07-12 LAB — THYROID PANEL WITH TSH
Free Thyroxine Index: 1.9 (ref 1.2–4.9)
T3 Uptake Ratio: 25 % (ref 24–39)
T4, Total: 7.6 ug/dL (ref 4.5–12.0)
TSH: 2.8 u[IU]/mL (ref 0.450–4.500)

## 2021-07-12 LAB — HEMOGLOBIN A1C
Est. average glucose Bld gHb Est-mCnc: 114 mg/dL
Hgb A1c MFr Bld: 5.6 % (ref 4.8–5.6)

## 2021-07-12 NOTE — Assessment & Plan Note (Signed)
Ongoing. Patient has seen GI and did an Endoscopy/Colonoscopy and worked her up for Celiac.  Results were unremarkable and recommended she follow up with PCP.  Will repeat labs to rule out thyroid problem, anemia, and diabetes.  Patient gained 3 lbs since last visit. Will make recommendations based on lab results.  ?

## 2021-07-12 NOTE — Progress Notes (Signed)
Please let patient know that her lab work shows that her Iron and B12 are low.  I recommend she start an over the counter B12 supplement 1065mcg daily and an iron supplement.  Ferrous Sulfate 325mg  daily. Her pregnancy test is normal and her thyroid labs are normal.  I would like her to start these supplements and follow up in 3 months to see if her weight improves.  Please make her a 3 month follow up.

## 2021-10-11 ENCOUNTER — Ambulatory Visit: Payer: Managed Care, Other (non HMO) | Admitting: Nurse Practitioner

## 2021-11-05 ENCOUNTER — Ambulatory Visit: Payer: Managed Care, Other (non HMO) | Admitting: Nurse Practitioner

## 2021-11-05 ENCOUNTER — Encounter: Payer: Self-pay | Admitting: Nurse Practitioner

## 2021-11-05 DIAGNOSIS — D518 Other vitamin B12 deficiency anemias: Secondary | ICD-10-CM

## 2021-11-05 DIAGNOSIS — D649 Anemia, unspecified: Secondary | ICD-10-CM | POA: Insufficient documentation

## 2021-11-05 NOTE — Progress Notes (Signed)
BP 127/89   Pulse 89   Temp 97.6 F (36.4 C) (Oral)   Wt 170 lb 14.4 oz (77.5 kg)   LMP 10/25/2021 (Exact Date)   SpO2 98%   BMI 30.27 kg/m    Subjective:    Patient ID: Tina Francis, female    DOB: [birth year], 36 y.o.   MRN: [id number]  HPI: Tina Francis is a 36 y.o. female  Chief Complaint  Patient presents with   Obesity     Patient states she has been taking B12 supplements for the past 3 months- would like her B12 levels checked.      WEIGHT LOSS Patient states she has been taking a B12 supplement and would like her B12 checked.  States she feels like she has more energy and feels better than before.  She hasn't lost any more weight.    Relevant past medical, surgical, family and social history reviewed and updated as indicated. Interim medical history since our last visit reviewed. Allergies and medications reviewed and updated.  Review of Systems  Constitutional:  Negative for unexpected weight change.  Gastrointestinal:  Negative for diarrhea and nausea.    Per HPI unless specifically indicated above     Objective:    BP 127/89   Pulse 89   Temp 97.6 F (36.4 C) (Oral)   Wt 170 lb 14.4 oz (77.5 kg)   LMP 10/25/2021 (Exact Date)   SpO2 98%   BMI 30.27 kg/m   Wt Readings from Last 3 Encounters:  11/05/21 170 lb 14.4 oz (77.5 kg)  07/11/21 167 lb 6.4 oz (75.9 kg)  06/21/21 164 lb (74.4 kg)    Physical Exam Vitals and nursing note reviewed.  Constitutional:      General: She is not in acute distress.    Appearance: Normal appearance. She is normal weight. She is not ill-appearing, toxic-appearing or diaphoretic.  HENT:     Head: Normocephalic.     Right Ear: External ear normal.     Left Ear: External ear normal.     Nose: Nose normal.     Mouth/Throat:     Mouth: Mucous membranes are moist.     Pharynx: Oropharynx is clear.  Eyes:     General:        Right eye: No discharge.        Left eye: No discharge.     Extraocular  Movements: Extraocular movements intact.     Conjunctiva/sclera: Conjunctivae normal.     Pupils: Pupils are equal, round, and reactive to light.  Cardiovascular:     Rate and Rhythm: Normal rate and regular rhythm.     Heart sounds: No murmur heard. Pulmonary:     Effort: Pulmonary effort is normal. No respiratory distress.     Breath sounds: Normal breath sounds. No wheezing or rales.  Musculoskeletal:     Cervical back: Normal range of motion and neck supple.  Skin:    General: Skin is warm and dry.     Capillary Refill: Capillary refill takes less than 2 seconds.  Neurological:     General: No focal deficit present.     Mental Status: She is alert and oriented to person, place, and time. Mental status is at baseline.  Psychiatric:        Mood and Affect: Mood normal.        Behavior: Behavior normal.        Thought Content: Thought content normal.  Judgment: Judgment normal.     Results for orders placed or performed in visit on 07/11/21  Comp Met (CMET)  Result Value Ref Range   Glucose 94 70 - 99 mg/dL   BUN 6 6 - 20 mg/dL   Creatinine, Ser 0.66 0.57 - 1.00 mg/dL   eGFR 118 >59 mL/min/1.73   BUN/Creatinine Ratio 9 9 - 23   Sodium 138 134 - 144 mmol/L   Potassium 3.6 3.5 - 5.2 mmol/L   Chloride 100 96 - 106 mmol/L   CO2 23 20 - 29 mmol/L   Calcium 9.7 8.7 - 10.2 mg/dL   Total Protein 7.5 6.0 - 8.5 g/dL   Albumin 4.6 3.8 - 4.8 g/dL   Globulin, Total 2.9 1.5 - 4.5 g/dL   Albumin/Globulin Ratio 1.6 1.2 - 2.2   Bilirubin Total 0.2 0.0 - 1.2 mg/dL   Alkaline Phosphatase 87 44 - 121 IU/L   AST 13 0 - 40 IU/L   ALT 10 0 - 32 IU/L  HgB A1c  Result Value Ref Range   Hgb A1c MFr Bld 5.6 4.8 - 5.6 %   Est. average glucose Bld gHb Est-mCnc 114 mg/dL  Anemia Profile B  Result Value Ref Range   Total Iron Binding Capacity 428 250 - 450 ug/dL   UIBC 382 131 - 425 ug/dL   Iron 46 27 - 159 ug/dL   Iron Saturation 11 (L) 15 - 55 %   Ferritin 10 (L) 15 - 150 ng/mL    Vitamin B-12 138 (L) 232 - 1,245 pg/mL   Folate 7.6 >3.0 ng/mL   WBC 9.9 3.4 - 10.8 x10E3/uL   RBC 5.14 3.77 - 5.28 x10E6/uL   Hemoglobin 12.5 11.1 - 15.9 g/dL   Hematocrit 39.5 34.0 - 46.6 %   MCV 77 (L) 79 - 97 fL   MCH 24.3 (L) 26.6 - 33.0 pg   MCHC 31.6 31.5 - 35.7 g/dL   RDW 14.2 11.7 - 15.4 %   Platelets 273 150 - 450 x10E3/uL   Neutrophils 69 Not Estab. %   Lymphs 23 Not Estab. %   Monocytes 7 Not Estab. %   Eos 1 Not Estab. %   Basos 0 Not Estab. %   Neutrophils Absolute 6.8 1.4 - 7.0 x10E3/uL   Lymphocytes Absolute 2.2 0.7 - 3.1 x10E3/uL   Monocytes Absolute 0.7 0.1 - 0.9 x10E3/uL   EOS (ABSOLUTE) 0.1 0.0 - 0.4 x10E3/uL   Basophils Absolute 0.0 0.0 - 0.2 x10E3/uL   Immature Granulocytes 0 Not Estab. %   Immature Grans (Abs) 0.0 0.0 - 0.1 x10E3/uL   Retic Ct Pct 1.2 0.6 - 2.6 %  Thyroid peroxidase antibody  Result Value Ref Range   Thyroperoxidase Ab SerPl-aCnc <9 0 - 34 IU/mL  Thyroid Panel With TSH  Result Value Ref Range   TSH 2.800 0.450 - 4.500 uIU/mL   T4, Total 7.6 4.5 - 12.0 ug/dL   T3 Uptake Ratio 25 24 - 39 %   Free Thyroxine Index 1.9 1.2 - 4.9  Beta hCG quant (ref lab)  Result Value Ref Range   hCG Quant <1 mIU/mL      Assessment & Plan:   Problem List Items Addressed This Visit       Other   Anemia    Chronic. Labs ordered today. Will make recommendations based on lab results.       Relevant Orders   CBC w/Diff   B12     Follow up plan:  Return in about 4 months (around 03/07/2022) for Physical and Fasting labs.

## 2021-11-05 NOTE — Assessment & Plan Note (Signed)
Chronic.  Labs ordered today. Will make recommendations based on lab results.  

## 2021-11-06 LAB — CBC WITH DIFFERENTIAL/PLATELET
Basophils Absolute: 0 10*3/uL (ref 0.0–0.2)
Basos: 1 %
EOS (ABSOLUTE): 0.1 10*3/uL (ref 0.0–0.4)
Eos: 2 %
Hematocrit: 40 % (ref 34.0–46.6)
Hemoglobin: 12.7 g/dL (ref 11.1–15.9)
Immature Grans (Abs): 0 10*3/uL (ref 0.0–0.1)
Immature Granulocytes: 0 %
Lymphocytes Absolute: 2.3 10*3/uL (ref 0.7–3.1)
Lymphs: 28 %
MCH: 26.1 pg — ABNORMAL LOW (ref 26.6–33.0)
MCHC: 31.8 g/dL (ref 31.5–35.7)
MCV: 82 fL (ref 79–97)
Monocytes Absolute: 0.7 10*3/uL (ref 0.1–0.9)
Monocytes: 8 %
Neutrophils Absolute: 5 10*3/uL (ref 1.4–7.0)
Neutrophils: 61 %
Platelets: 223 10*3/uL (ref 150–450)
RBC: 4.87 x10E6/uL (ref 3.77–5.28)
RDW: 13.8 % (ref 11.7–15.4)
WBC: 8.2 10*3/uL (ref 3.4–10.8)

## 2021-11-06 LAB — VITAMIN B12: Vitamin B-12: 1021 pg/mL (ref 232–1245)

## 2021-11-06 NOTE — Progress Notes (Signed)
Hi Tareva. It was nice to see you yesterday.  Your lab work looks good.  No concerns at this time. Continue with your current medication regimen.  Follow up as discussed.  Please let me know if you have any questions.

## 2021-11-21 ENCOUNTER — Ambulatory Visit: Payer: Self-pay

## 2021-11-21 ENCOUNTER — Encounter: Payer: Self-pay | Admitting: Nurse Practitioner

## 2021-11-21 ENCOUNTER — Telehealth (INDEPENDENT_AMBULATORY_CARE_PROVIDER_SITE_OTHER): Payer: Managed Care, Other (non HMO) | Admitting: Nurse Practitioner

## 2021-11-21 VITALS — HR 79

## 2021-11-21 DIAGNOSIS — U071 COVID-19: Secondary | ICD-10-CM

## 2021-11-21 MED ORDER — ONDANSETRON HCL 4 MG PO TABS: 4.0000 mg | ORAL_TABLET | Freq: Three times a day (TID) | ORAL | 0 refills | Status: DC | PRN

## 2021-11-21 NOTE — Telephone Encounter (Signed)
Summary: cold and flu like symptoms / rx req   The patient tested positive for COVID 19 via an at home test 11/20/21   The patient shares that they're currently experiencing a foggy feeling   The patient would like to be prescribed something for their discomfort   Pease contact further when possible      Chief Complaint: cough Symptoms: fatigue, "brain fog", SOB with exertion, body aches Frequency: Monday Pertinent Negatives: Patient denies wheezing, fever, chills, sore throat Disposition: [] ED /[] Urgent Care (no appt availability in office) / [x] Appointment(In office/virtual)/ []  [facility]/ [] Home Care/ [] Refused Recommended Disposition /[] [facility]/ []  Follow-up with PCP Additional Notes: Agent made pt an MyChart VV with PCP for 1300 today.  Reason for Disposition  MILD difficulty breathing (e.g., minimal/no SOB at rest, SOB with walking, pulse <100)  Answer Assessment - Initial Assessment Questions 1. COVID-19 DIAGNOSIS: "How do you know that you have COVID?" (e.g., positive lab test or self-test, diagnosed by doctor or NP/PA, symptoms after exposure).     Self test 2. COVID-19 EXPOSURE: "Was there any known exposure to COVID before the symptoms began?" CDC Definition of close contact: within 6 feet (2 meters) for a total of 15 minutes or more over a 24-hour period.      husband 3. ONSET: "When did the COVID-19 symptoms start?"      Monday 4. WORST SYMPTOM: "What is your worst symptom?" (e.g., cough, fever, shortness of breath, muscle aches)     Body pain 5. COUGH: "Do you have a cough?" If Yes, ask: "How bad is the cough?"       Yes occasional in am 6. FEVER: "Do you have a fever?" If Yes, ask: "What is your temperature, how was it measured, and when did it start?"     no 7. RESPIRATORY STATUS: "Describe your breathing?" (e.g., normal; shortness of breath, wheezing, unable to speak)      Normal, SOB with exertion  8. BETTER-SAME-WORSE: "Are you getting  better, staying the same or getting worse compared to yesterday?"  If getting worse, ask, "In what way?"     worse 9. OTHER SYMPTOMS: "Do you have any other symptoms?"  (e.g., chills, fatigue, headache, loss of smell or taste, muscle pain, sore throat)     Fatigue, brain fog, cough,  10. HIGH RISK DISEASE: "Do you have any chronic medical problems?" (e.g., asthma, heart or lung disease, weak immune system, obesity, etc.)       Obese, irregular heartbeat, B12 deficiency 11. VACCINE: "Have you had the COVID-19 vaccine?" If Yes, ask: "Which one, how many shots, when did you get it?"       1st one and 1 boosterno 12. PREGNANCY: "Is there any chance you are pregnant?" "When was your last menstrual period?"       Need to test for pregnancy 13. O2 SATURATION MONITOR:  "Do you use an oxygen saturation monitor (pulse oximeter) at home?" If Yes, ask "What is your reading (oxygen level) today?" "What is your usual oxygen saturation reading?" (e.g., 95%)       no  Protocols used: Coronavirus (COVID-19) Diagnosed or Suspected-A-AH

## 2021-11-21 NOTE — Progress Notes (Signed)
LMP 10/25/2021 (Exact Date)    Subjective:    Patient ID: Tina Francis, female    DOB: 1985-04-28, 36 y.o.   MRN: [id number]  HPI: Tina Francis is a 36 y.o. female  Chief Complaint  Patient presents with   Possible Pregnancy    Patient concerned for possible pregnancy. Last period onset 10/25/2021. Patient states she has been trying to conceive for the past two months   Covid Positive    Patient reports testing + for covid this morning. C/o foggy headed, slight HA, cough. Patient reports emesis even when drinking water.    Sore Throat   UPPER RESPIRATORY TRACT INFECTION Worst symptom: COVID positive.  Symptoms started on Monday Fever: no Cough: yes Shortness of breath: no Wheezing: no Chest pain: no Chest tightness: no Chest congestion: no Nasal congestion: no Runny nose: no Post nasal drip: no Sneezing: no Sore throat: yes Swollen glands: no Sinus pressure: no Headache: yes Face pain: no Toothache: no Ear pain: no bilateral Ear pressure: no bilateral Eyes red/itching:no Eye drainage/crusting: no  Vomiting: yes Rash: no Fatigue: yes Having brain fog and body aches Sick contacts: no Strep contacts: no  Context: stable Recurrent sinusitis: no Relief with OTC cold/cough medications: no  Treatments attempted: none   Relevant past medical, surgical, family and social history reviewed and updated as indicated. Interim medical history since our last visit reviewed. Allergies and medications reviewed and updated.  Review of Systems  Constitutional:  Positive for fatigue. Negative for fever.  HENT:  Positive for congestion and sore throat. Negative for dental problem, ear pain, postnasal drip, rhinorrhea, sinus pressure, sinus pain and sneezing.   Respiratory:  Negative for cough, shortness of breath and wheezing.   Cardiovascular:  Negative for chest pain.  Gastrointestinal:  Negative for vomiting.  Skin:  Negative for rash.  Neurological:  Positive for  headaches.    Per HPI unless specifically indicated above     Objective:    LMP 10/25/2021 (Exact Date)   Wt Readings from Last 3 Encounters:  11/05/21 170 lb 14.4 oz (77.5 kg)  07/11/21 167 lb 6.4 oz (75.9 kg)  06/21/21 164 lb (74.4 kg)    Physical Exam Vitals and nursing note reviewed.  HENT:     Head: Normocephalic.     Right Ear: Hearing normal.     Left Ear: Hearing normal.     Nose: Nose normal.  Eyes:     Pupils: Pupils are equal, round, and reactive to light.  Pulmonary:     Effort: Pulmonary effort is normal. No respiratory distress.  Neurological:     Mental Status: She is alert.  Psychiatric:        Mood and Affect: Mood normal.        Behavior: Behavior normal.        Thought Content: Thought content normal.        Judgment: Judgment normal.     Results for orders placed or performed in visit on 11/05/21  CBC w/Diff  Result Value Ref Range   WBC 8.2 3.4 - 10.8 x10E3/uL   RBC 4.87 3.77 - 5.28 x10E6/uL   Hemoglobin 12.7 11.1 - 15.9 g/dL   Hematocrit 70.3 50.0 - 46.6 %   MCV 82 79 - 97 fL   MCH 26.1 (L) 26.6 - 33.0 pg   MCHC 31.8 31.5 - 35.7 g/dL   RDW 93.8 18.2 - 99.3 %   Platelets 223 150 - 450 x10E3/uL   Neutrophils 61 Not Estab. %  Lymphs 28 Not Estab. %   Monocytes 8 Not Estab. %   Eos 2 Not Estab. %   Basos 1 Not Estab. %   Neutrophils Absolute 5.0 1.4 - 7.0 x10E3/uL   Lymphocytes Absolute 2.3 0.7 - 3.1 x10E3/uL   Monocytes Absolute 0.7 0.1 - 0.9 x10E3/uL   EOS (ABSOLUTE) 0.1 0.0 - 0.4 x10E3/uL   Basophils Absolute 0.0 0.0 - 0.2 x10E3/uL   Immature Granulocytes 0 Not Estab. %   Immature Grans (Abs) 0.0 0.0 - 0.1 x10E3/uL  B12  Result Value Ref Range   Vitamin B-12 1,021 232 - 1,245 pg/mL      Assessment & Plan:   Problem List Items Addressed This Visit   None Visit Diagnoses     COVID-19    -  Primary   Continue with over the counter symptom management. Increase fluid intake. Manage fever and body aches with OTC tylenol/ibuprofen.  Will send Zofran for nausea.        Follow up plan: Return if symptoms worsen or fail to improve.   This visit was completed via MyChart due to the restrictions of the COVID-19 pandemic. All issues as above were discussed and addressed. Physical exam was done as above through visual confirmation on MyChart. If it was felt that the patient should be evaluated in the office, they were directed there. The patient verbally consented to this visit. Location of the patient: Home Location of the provider: Office Those involved with this call:  Provider: Larae Grooms, NP CMA: Anitra Lauth. CMA Front Desk/Registration: Servando Snare This encounter was conducted via video.  I spent 20 dedicated to the care of this patient on the date of this encounter to include previsit review of symptoms, plan of care, and follow up, face to face time with the patient, and post visit ordering of testing.

## 2022-01-19 IMAGING — MG DIGITAL DIAGNOSTIC BILAT W/ TOMO W/ CAD
8 of 14 series · 8 of 40 positions shown · non-contrast
Comparison: None.

CLINICAL DATA: Patient presents for evaluation of bruising within
the superior periareolar left breast.

EXAM:
DIGITAL DIAGNOSTIC BILATERAL MAMMOGRAM WITH TOMOSYNTHESIS AND CAD;
ULTRASOUND LEFT BREAST LIMITED
TECHNIQUE: Bilateral digital diagnostic mammography and breast tomosynthesis
was performed. The images were evaluated with computer-aided
detection.; Targeted ultrasound examination of the left breast was
performed

[R MLO synth-2D (1 of 2)]
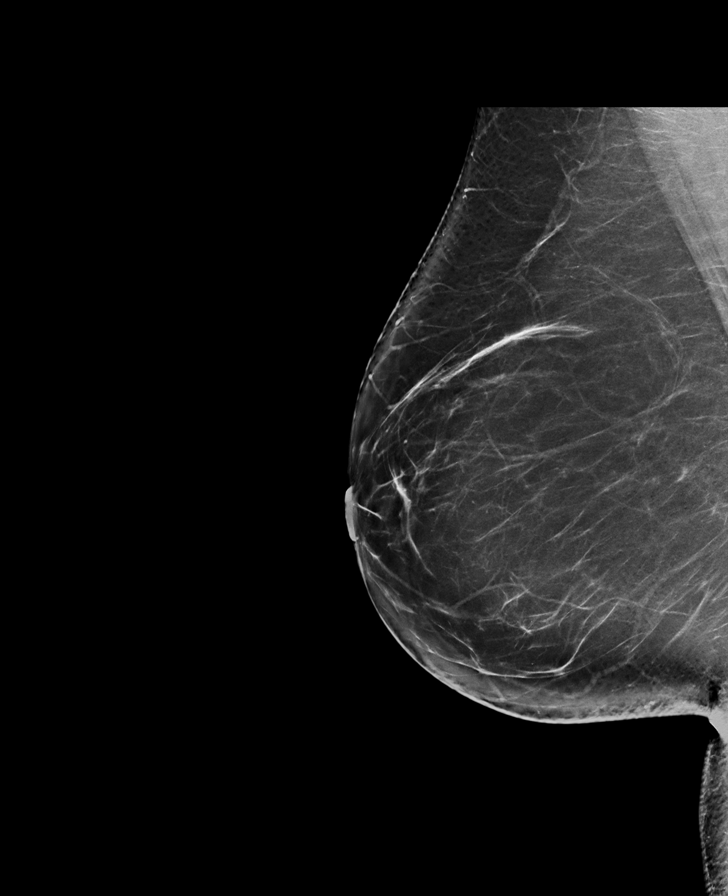

[L MLO synth-2D (1 of 2)]
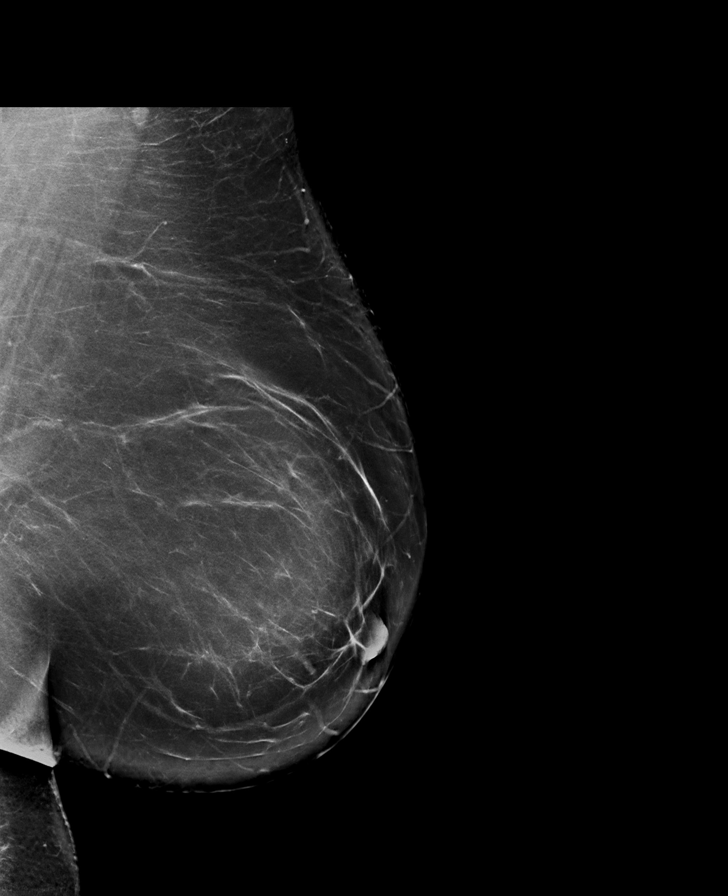

[R CC synth-2D]
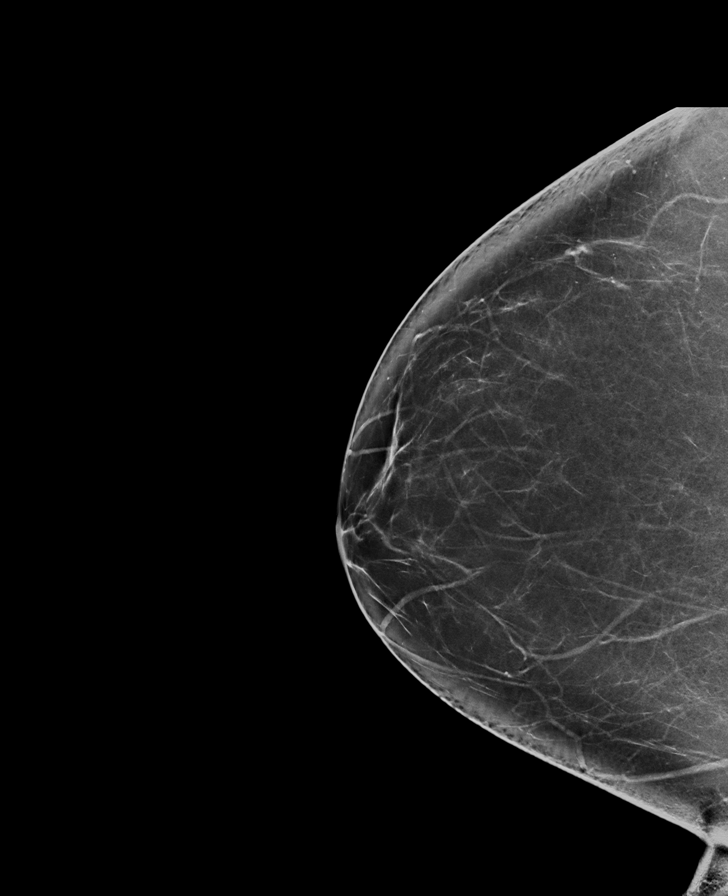

[L CC synth-2D]
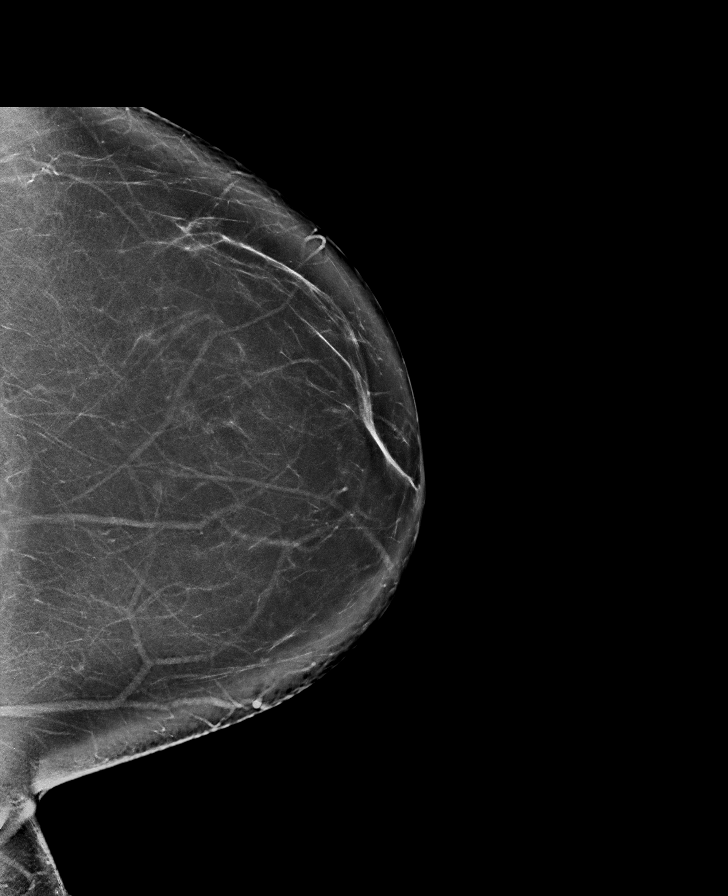

[R MLO synth-2D (2 of 2)]
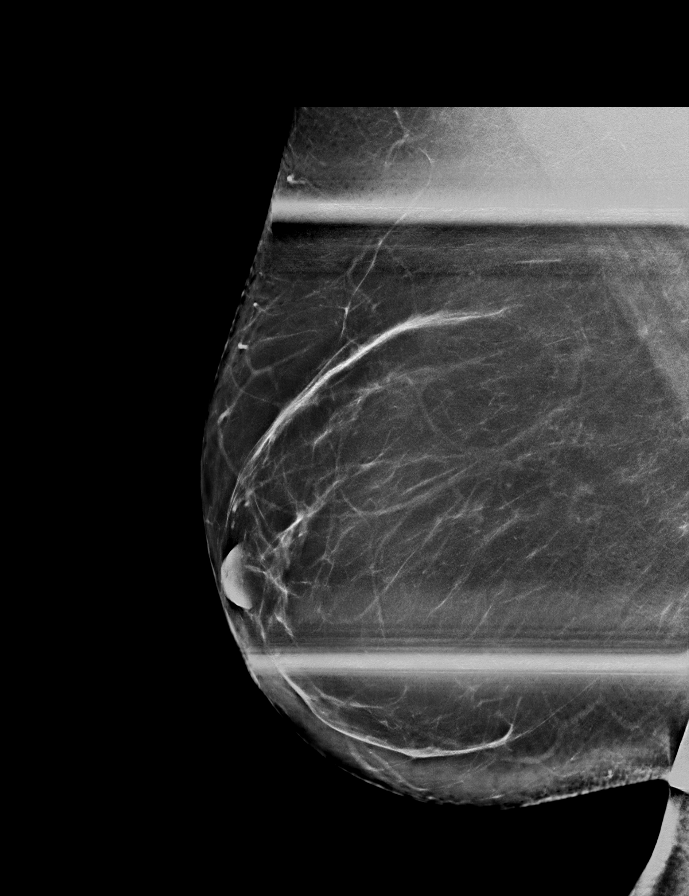

[R ML synth-2D]
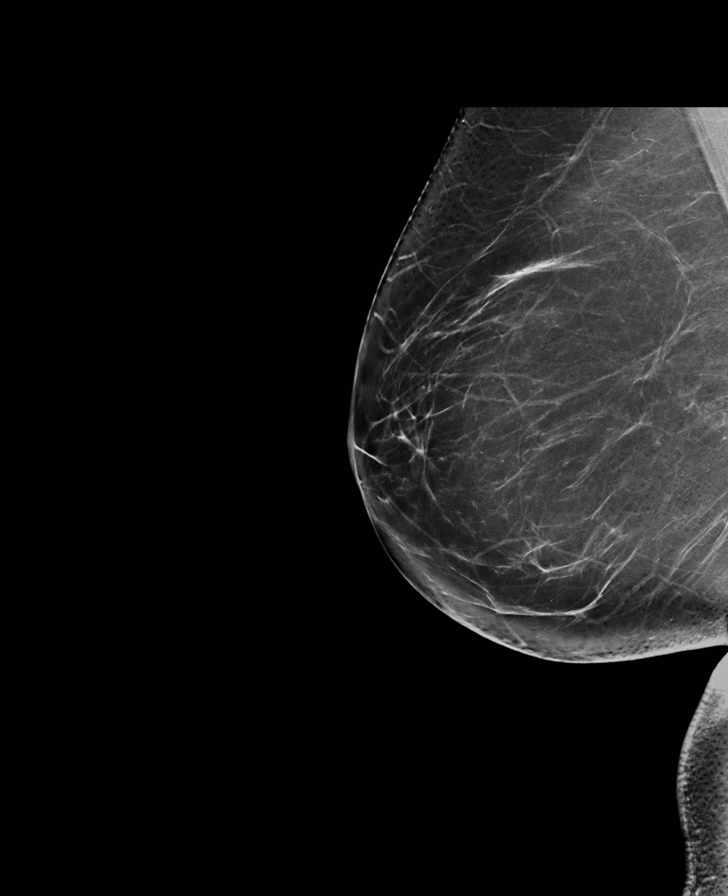

[L MLO synth-2D (2 of 2)]
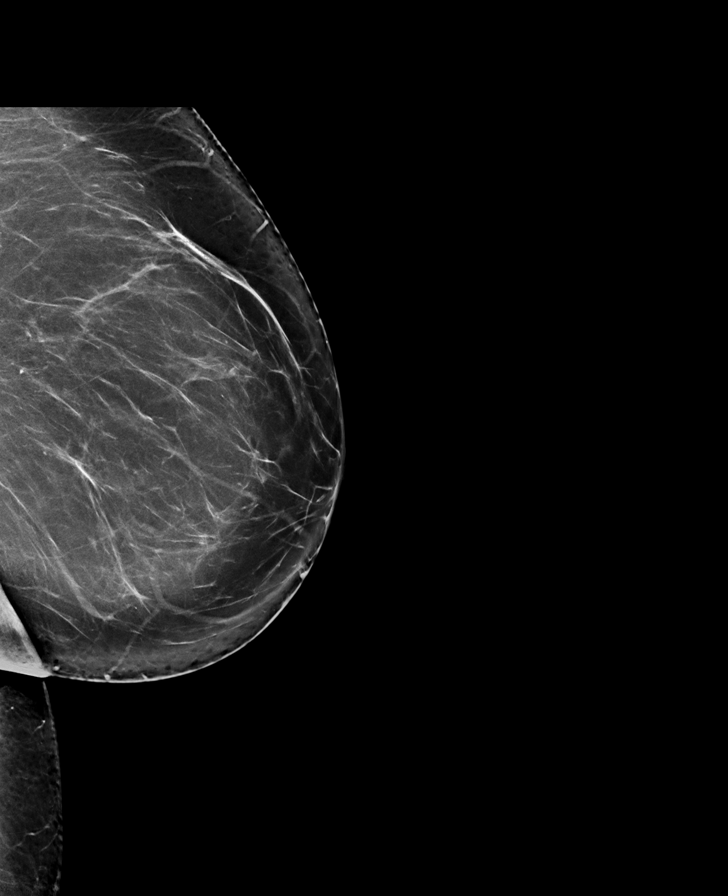

[L CC tomo · tomo slice 50/99.0]
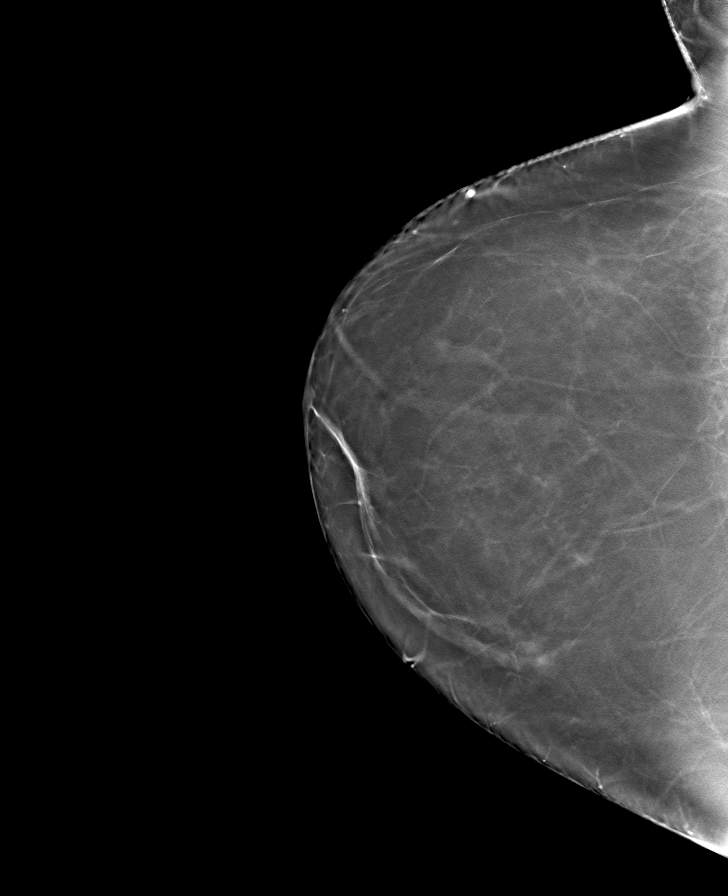

[8 of 40 positions shown; findings below may reference images not displayed]

ACR Breast Density Category b: There are scattered areas of
fibroglandular density.
FINDINGS: No concerning masses, calcifications or distortion identified within
either breast.

On physical exam, there is a faint area of bruising within the
superior periareolar left breast.

Targeted ultrasound is performed, showing normal tissue without
suspicious mass underlying the faint area of bruising within the
superior left breast 11 o'clock position 2 cm from nipple the site
of mild cutaneous bruising.
IMPRESSION: No mammographic evidence for malignancy.

RECOMMENDATION:
Continued clinical evaluation to ensure resolution of bruising
within the superior anterior left breast.

Annual screening mammography at age 40.

I have discussed the findings and recommendations with the patient.
If applicable, a reminder letter will be sent to the patient
regarding the next appointment.

BI-RADS CATEGORY  1: Negative.

## 2022-01-19 IMAGING — US US BREAST*L* LIMITED INC AXILLA
1 series · 2 of 2 positions shown · non-contrast
Comparison: None.

CLINICAL DATA: Patient presents for evaluation of bruising within
the superior periareolar left breast.

EXAM:
DIGITAL DIAGNOSTIC BILATERAL MAMMOGRAM WITH TOMOSYNTHESIS AND CAD;
ULTRASOUND LEFT BREAST LIMITED
TECHNIQUE: Bilateral digital diagnostic mammography and breast tomosynthesis
was performed. The images were evaluated with computer-aided
detection.; Targeted ultrasound examination of the left breast was
performed

[Series 1: us breast*left* limited inc axilla · 0.10mm/px · 2 of 2 slices shown]
[im 1/2]
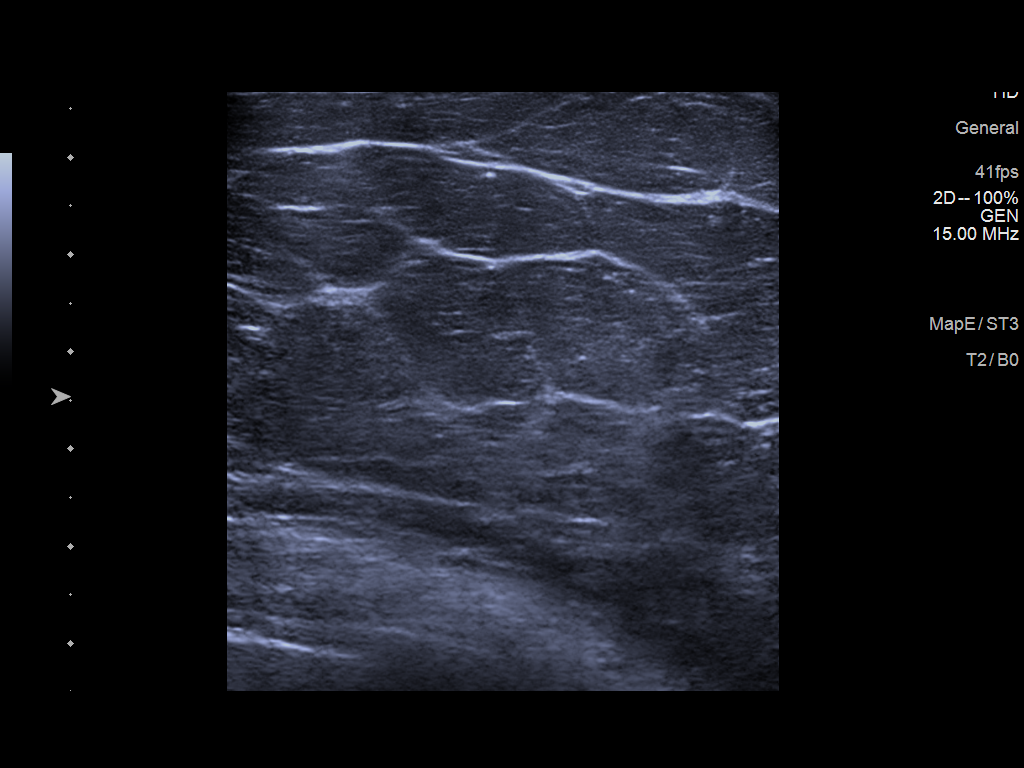
[im 2/2]
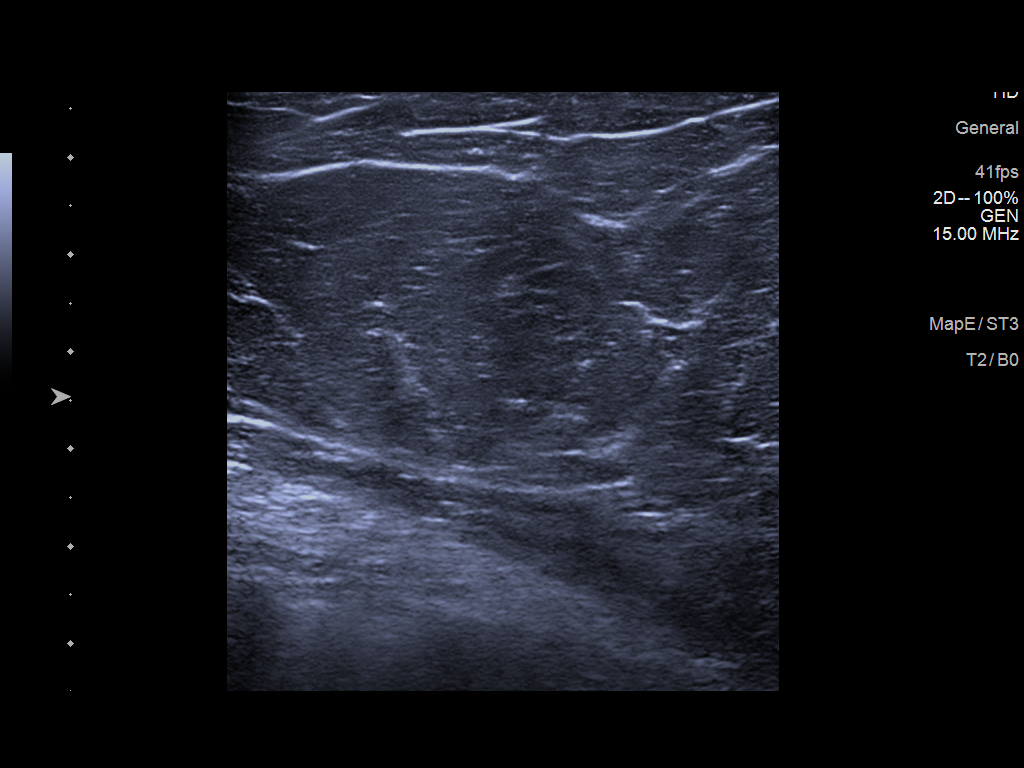

[2 of 2 positions shown; findings below may reference images not displayed]

ACR Breast Density Category b: There are scattered areas of
fibroglandular density.
FINDINGS: No concerning masses, calcifications or distortion identified within
either breast.

On physical exam, there is a faint area of bruising within the
superior periareolar left breast.

Targeted ultrasound is performed, showing normal tissue without
suspicious mass underlying the faint area of bruising within the
superior left breast 11 o'clock position 2 cm from nipple the site
of mild cutaneous bruising.
IMPRESSION: No mammographic evidence for malignancy.

RECOMMENDATION:
Continued clinical evaluation to ensure resolution of bruising
within the superior anterior left breast.

Annual screening mammography at age 41.

I have discussed the findings and recommendations with the patient.
If applicable, a reminder letter will be sent to the patient
regarding the next appointment.

BI-RADS CATEGORY  1: Negative.

## 2022-02-04 ENCOUNTER — Encounter: Payer: Self-pay | Admitting: Nurse Practitioner

## 2022-02-05 ENCOUNTER — Ambulatory Visit: Payer: Managed Care, Other (non HMO) | Admitting: Physician Assistant

## 2022-02-05 ENCOUNTER — Encounter: Payer: Self-pay | Admitting: Physician Assistant

## 2022-02-05 VITALS — BP 127/84 | HR 84 | Temp 98.3°F | Wt 174.0 lb

## 2022-02-05 DIAGNOSIS — Z23 Encounter for immunization: Secondary | ICD-10-CM

## 2022-02-05 DIAGNOSIS — R6884 Jaw pain: Secondary | ICD-10-CM | POA: Diagnosis not present

## 2022-02-05 DIAGNOSIS — G44219 Episodic tension-type headache, not intractable: Secondary | ICD-10-CM

## 2022-02-05 MED ORDER — MELOXICAM 15 MG PO TABS: 15.0000 mg | ORAL_TABLET | Freq: Every day | ORAL | 0 refills | Status: AC

## 2022-02-05 NOTE — Patient Instructions (Signed)
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

## 2022-02-05 NOTE — Progress Notes (Signed)
Acute Office Visit   Patient: Tina Francis   DOB: 06/02/1985   36 y.o. Female  MRN: [id number] Visit Date: 02/05/2022  Today's healthcare provider: Oswaldo Conroy Travarus Trudo, PA-C  Introduced myself to the patient as a Secondary school teacher and provided education on APPs in clinical practice.    Chief Complaint  Patient presents with   Ear Pain    Patient states her left ear has been hurting for about 2 days. 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    Subjective    HPI HPI     Ear Pain    Additional comments: Patient states her left ear has been hurting for about 2 days. 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       Last edited by Rolley Sims, CMA on 02/05/2022  1:27 PM.        Left ear pain    Onset: gradual  Duration: seems to be ongoing, intermittent since Sept Location: left ear and radiates to left temple  Alleviating: Ibuprofen and sleeping seems to improve it  Aggravating: popping jaw seems to flare it  Interventions: Ibuprofen   Pain level: 6-7/10 achy    Medications: Outpatient Medications Prior to Visit  Medication Sig   [DISCONTINUED] ondansetron (ZOFRAN) 4 MG tablet Take 1 tablet (4 mg total) by mouth every 8 (eight) hours as needed for nausea or vomiting.   No facility-administered medications prior to visit.    Review of Systems  Constitutional:  Negative for chills, fatigue and fever.  HENT:  Positive for ear pain. Negative for sore throat and tinnitus.   Eyes:  Negative for visual disturbance.       Pain along top of ocular orbit   Neurological:  Positive for headaches.       Objective    BP 127/84   Pulse 84   Temp 98.3 F (36.8 C)   Wt 174 lb (78.9 kg)   SpO2 97%   BMI 30.82 kg/m    Physical Exam Vitals reviewed.  Constitutional:      General: She is awake.     Appearance: Normal appearance. She is well-developed  and well-groomed.  HENT:     Head: Normocephalic and atraumatic.     Jaw: Tenderness present. No trismus, swelling or pain on movement.     Right Ear: Hearing, tympanic membrane, ear canal and external ear normal.     Left Ear: Hearing, tympanic membrane, ear canal and external ear normal.     Mouth/Throat:     Lips: Pink.     Pharynx: No pharyngeal swelling, oropharyngeal exudate, posterior oropharyngeal erythema or uvula swelling.  Pulmonary:     Effort: Pulmonary effort is normal.  Musculoskeletal:     Cervical back: Normal range of motion.  Lymphadenopathy:     Head:     Right side of head: No submental, submandibular or preauricular adenopathy.     Left side of head: No submental, submandibular or preauricular adenopathy.     Cervical:     Right cervical: No superficial or posterior cervical adenopathy.    Left cervical: No superficial or posterior cervical adenopathy.     Upper Body:     Right upper body: No supraclavicular adenopathy.     Left upper body: No supraclavicular adenopathy.  Neurological:  Mental Status: She is alert.  Psychiatric:        Behavior: Behavior is cooperative.       No results found for any visits on 02/05/22.  Assessment & Plan      No follow-ups on file.      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 but it has gotten worse with illness in Sept Cannot rule out potential Trigeminal neuralgia or bruxism as potential causes of pain given pain along jaw, ocular orbit and temporal headaches  Provided jaw ROM exercises today and script for Meloxixam 15 mg PO QD to assist with inflammation Reviewed that she can also use Tylenol PRN as well for further discomfort  May need to seek assistance with TMJ dysfunction with physical therapy and dental professional  Follow up as needed for persistent or progressing symptoms     Relevant Medications   meloxicam (MOBIC) 15 MG tablet   Episodic tension-type headache, not intractable     Suspect this is likely secondary to jaw pain  Cannot definitively rule out Trigeminal neuralgia, or bruxism at this time as contributory etiologies given recent illness and jaw pain but suspect this is likely secondary to 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   I, Sebastian Lurz E Arloa Prak, PA-C, have reviewed all documentation for this visit. The documentation on 02/05/22 for the exam, diagnosis, procedures, and orders are all accurate and complete.   Jacquelin Hawking, MHS, PA-C [facility]

## 2022-03-05 ENCOUNTER — Ambulatory Visit: Payer: Managed Care, Other (non HMO) | Admitting: Nurse Practitioner

## 2022-03-15 ENCOUNTER — Encounter: Payer: Managed Care, Other (non HMO) | Admitting: Nurse Practitioner

## 2022-12-25 ENCOUNTER — Other Ambulatory Visit: Payer: Self-pay | Admitting: Internal Medicine

## 2022-12-25 DIAGNOSIS — R002 Palpitations: Secondary | ICD-10-CM

## 2022-12-27 ENCOUNTER — Ambulatory Visit
Admission: RE | Admit: 2022-12-27 | Discharge: 2022-12-27 | Disposition: A | Discharge status: Home / Self Care | Payer: Managed Care, Other (non HMO) | Source: Ambulatory Visit | Attending: Internal Medicine | Admitting: Internal Medicine

## 2022-12-27 DIAGNOSIS — R002 Palpitations: Secondary | ICD-10-CM | POA: Insufficient documentation

## 2023-02-05 ENCOUNTER — Ambulatory Visit: Payer: Managed Care, Other (non HMO) | Admitting: Dietician

## 2023-03-24 ENCOUNTER — Encounter: Payer: Managed Care, Other (non HMO) | Attending: Obstetrics and Gynecology | Admitting: Dietician

## 2023-03-24 ENCOUNTER — Encounter: Payer: Self-pay | Admitting: Dietician

## 2023-03-24 NOTE — Progress Notes (Signed)
 Medical Nutrition Therapy: Visit start time: 1315  end time: 1420  Assessment:   Referral Diagnosis: hypertension, obesity Other medical history/ diagnoses: PCOS Psychosocial issues/ stress concerns: none  Medications, supplements: reconciled list in medical record   Current weight: 173.2lbs with hoodie  Height: 5'2 BMI: 31.68   Progress and evaluation:  Patient reports some unintentional weight loss about 1 year ago, when having abdominal pain, which was due to B12 deficiency. She took daily supplement until normal level was achieved; now taking once a week. She stopped walking for exercise when discovering B12 deficiency, and as not resumed.  Reports increased gas when trying to increase protein intake with pulses, severe enough to trigger headache and vomiting episodes.   Mother died at age 60 of heart disease; patient is concerned with reducing her risk of heart disease. Food allergies: none known; recently tested for celiac when having colonoscopy, result was negative for celiac Special diet practices: follows lacto-vegetarian eating pattern Patient seeks help with some weight loss, adequate and balanced diet, to help with fertility    Dietary Intake:  Usual eating pattern includes 3 meals and 1 snacks per day. Dining out frequency: 0-1 meals per week. Who plans meals/ buys groceries? self Who prepares meals? self  Breakfast: idli rice cake and grapes (2-3); semolina meal (scrambled egg consistency); coconut oil used for cooking, small amounts Snack: hot tea with milk Lunch: rice, 1 cup + sauteed veg, curry coconut and yogurt based Snack: hot tea + baked item ie croissant; fruit bites Supper: pasta with homemade sauce; roti (2-3) with paneer curry + mixed veg; pizza/ restaurant meal maybe once a week Snack: none Beverages: hot tea; warm/ hot water  come with coriander or chia seeds  Physical activity: no recent exercise; has dance class weekly   Intervention:   Nutrition  Care Education:   Basic nutrition: basic food groups; appropriate nutrient balance; general nutrition guidelines    Weight control: importance of balancing carb foods with protein and healthy fats; healthy carb choices; role of insulin in weight control; role of exercise and suitable options Advanced nutrition:  recipe modification; cooking techniques; dining out; food label reading Hypertension: limiting high sodium foods, including plenty of veg and fruits as sources of potassium, magnesium; importance of regular exercise  Other intervention notes: Patient is making generally healthy food choices and is motivated to continue She feels her food portions are likely close to recommended amounts; working on regular exercise will likely have the most impact on weight and blood pressure. Established goals with direction from patient.   Nutritional Diagnosis:  [place]-2.2 Altered nutrition-related laboratory As related to history of B12 and iron deficiencies.  As evidenced by history of low B12 and iron status. [place]-3.3 Overweight/obesity As related to inadequate physical activity, possible history of excess calories.  As evidenced by patient with current BMI of 31.68.   Education Materials given:  Vegetarian Proteins [employer] exchange list Plant-based Nutrition Basics (ACLM) Visit summary with goals/ instructions to be viewed via patient portal   Learner/ who was taught:  Patient   Level of understanding: Verbalizes/ demonstrates competency  Demonstrated degree of understanding via:   Teach back Learning barriers: None  Willingness to learn/ readiness for change: Eager, change in progress  Monitoring and Evaluation:  Dietary intake, exercise, and body weight      follow up:  05/12/23 at 1:15pm

## 2023-03-24 NOTE — Patient Instructions (Signed)
 Start some regular exercise; find enjoyable activities and start with a short time, maybe 15 minutes, gradually increasing the time as energy increases. Try app such as FitOn to see or try a variety of exercise options or videos Include a protein source with each meal daily. Try other vegetarian options, such as Tempeh, for more variety if needed. Consider trying Beano digestive enzyme supplement for help with gas/ abdominal pain when eating certain foods.

## 2023-05-12 ENCOUNTER — Ambulatory Visit: Payer: Managed Care, Other (non HMO) | Admitting: Dietician

## 2023-06-05 ENCOUNTER — Encounter: Payer: Self-pay | Admitting: Dietician

## 2023-06-05 NOTE — Progress Notes (Signed)
 Patient cancelled her appointment for 05/12/23 and has not yet rescheduled. Sent notification to referring provider.

## 2023-10-03 ENCOUNTER — Other Ambulatory Visit: Payer: Self-pay | Admitting: Obstetrics and Gynecology

## 2023-10-03 DIAGNOSIS — N644 Mastodynia: Secondary | ICD-10-CM

## 2023-11-07 ENCOUNTER — Ambulatory Visit
Admission: RE | Admit: 2023-11-07 | Discharge: 2023-11-07 | Disposition: A | Discharge status: Home / Self Care | Source: Ambulatory Visit | Attending: Obstetrics and Gynecology | Admitting: Obstetrics and Gynecology

## 2023-11-07 ENCOUNTER — Other Ambulatory Visit: Payer: Self-pay | Admitting: Obstetrics and Gynecology

## 2023-11-07 DIAGNOSIS — N644 Mastodynia: Secondary | ICD-10-CM
